# Patient Record
Sex: Male | Born: 1975 | Race: White | Hispanic: No | Marital: Single | Smoking: Current every day smoker
Health system: Southern US, Community
[De-identification: ages and names within clinical notes are randomized; demographics above are authoritative.]

## PROBLEM LIST (undated history)

## (undated) DIAGNOSIS — J189 Pneumonia, unspecified organism: Secondary | ICD-10-CM

---

## 2004-09-13 ENCOUNTER — Emergency Department: Payer: Self-pay | Admitting: Emergency Medicine

## 2004-09-13 ENCOUNTER — Other Ambulatory Visit: Payer: Self-pay

## 2007-06-10 ENCOUNTER — Emergency Department: Payer: Self-pay | Admitting: Emergency Medicine

## 2007-06-17 ENCOUNTER — Emergency Department: Payer: Self-pay | Admitting: Internal Medicine

## 2014-02-05 HISTORY — PX: NECK SURGERY: SHX720

## 2016-07-31 ENCOUNTER — Emergency Department
Admission: EM | Admit: 2016-07-31 | Discharge: 2016-07-31 | Disposition: A | Discharge status: Home / Self Care | Payer: Self-pay | Attending: Emergency Medicine | Admitting: Emergency Medicine

## 2016-07-31 ENCOUNTER — Encounter: Payer: Self-pay | Admitting: Emergency Medicine

## 2016-07-31 DIAGNOSIS — L237 Allergic contact dermatitis due to plants, except food: Secondary | ICD-10-CM | POA: Insufficient documentation

## 2016-07-31 DIAGNOSIS — F17200 Nicotine dependence, unspecified, uncomplicated: Secondary | ICD-10-CM | POA: Insufficient documentation

## 2016-07-31 MED ORDER — PREDNISONE 10 MG PO TABS: ORAL_TABLET | ORAL | 0 refills | Status: DC

## 2016-07-31 MED ORDER — DEXAMETHASONE SODIUM PHOSPHATE 10 MG/ML IJ SOLN
10.0000 mg | Freq: Once | INTRAMUSCULAR | Status: AC
  Administered 2016-07-31: 10 mg via INTRAMUSCULAR
  Filled 2016-07-31: qty 1

## 2016-07-31 NOTE — ED Triage Notes (Signed)
Pt with poison ivy all over for two days, been treating with calamine lotion and benedryl. No relief.

## 2016-07-31 NOTE — ED Notes (Signed)
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

## 2016-07-31 NOTE — Discharge Instructions (Signed)
Follow-up with [facility] if any continued problems. Begin taking prednisone as directed decreasing by one tablet every day. You may also take Benadryl at night if needed for itching. Cortizone cream is over-the-counter and can be applied directly to places that itch. Also avoid getting hot and sweaty as this will cause increased itching.

## 2016-07-31 NOTE — ED Provider Notes (Signed)
[facility] Emergency Department Provider Note ____________________________________________  Time seen: 9:14 AM  I have reviewed the triage vital signs and the nursing notes.  HISTORY  Chief Complaint  Poison Ivy   HPI Benjamin Perry is a 41 y.o. male is here with complaint of most likely poison oak or poison ivy. 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 Patient states that it is spreading and itching is uncontrolled with over-the-counter products. Initially it began on his legs and now has spread up around his neck and upper extremities.    History reviewed. No pertinent past medical history.  There are no active problems to display for this patient.   History reviewed. No pertinent surgical history.  Prior to Admission medications   Medication Sig Start Date End Date Taking? Authorizing Provider  predniSONE (DELTASONE) 10 MG tablet Take 6 tablets  today, on day 2 take 5 tablets, day 3 take 4 tablets, day 4 take 3 tablets, day 5 take  2 tablets and 1 tablet the last day 07/31/16   Tommi Rumps, PA-C    Allergies Penicillins  No family history on file.  Social History Social History  Substance Use Topics  . Smoking status: Current Every Day Smoker  . Smokeless tobacco: Never Used  . Alcohol use No    Review of Systems  Constitutional: Negative for fever. Cardiovascular: Negative for chest pain. Respiratory: Negative for shortness of breath. Skin:Positive for rash. Neurological: Negative for headaches, focal weakness or numbness. ____________________________________________  PHYSICAL EXAM:  VITAL SIGNS: ED Triage Vitals  Enc Vitals Group     BP 07/31/16 0907 124/71     Pulse Rate 07/31/16 0907 78     Resp 07/31/16 0907 18     Temp 07/31/16 0907 98 F (36.7 C)     Temp Source 07/31/16 0907 Oral     SpO2 07/31/16 0907 99 %     Weight 07/31/16 0902 160 lb (72.6 kg)     Height --    Head Circumference --      Peak Flow --      Pain Score --      Pain Loc --      Pain Edu? --      Excl. in GC? --     Constitutional: Alert and oriented. Well appearing and in no distress. Head: Normocephalic and atraumatic. Neck: Supple.  Cardiovascular: Normal rate, regular rhythm. Normal distal pulses. Respiratory: Normal respiratory effort.  Gastrointestinal: Soft and nontender. No distention. Musculoskeletal: Nontender with normal range of motion in all extremities.  Neurologic:  Normal gait. Normal speech and language. No gross focal neurologic deficits are appreciated. Skin:  Skin is warm, dry and intact. Multiple vesicles are noted around both ankles. There are a few lesions on the upper extremities and around the neck that is consistent with poison medical poison ivy. Psychiatric: Mood and affect are normal. Patient exhibits appropriate insight and judgment.  INITIAL IMPRESSION / ASSESSMENT AND PLAN / ED COURSE  Patient was given Decadron 10 mg IM and also a prescription for prednisone Dosepak. Patient is encouraged to use over-the-counter cortisone cream as needed for continued itching on specific areas. He is also told he could take Benadryl at night to prevent itching.    ____________________________________________  FINAL CLINICAL IMPRESSION(S) / ED DIAGNOSES  Final diagnoses:  Contact dermatitis due to poison ivy     Tommi Rumps, PA-C 07/31/16 1024    Daryel November  E, MD 07/31/16 [id number]

## 2018-02-05 ENCOUNTER — Emergency Department
Admission: EM | Admit: 2018-02-05 | Discharge: 2018-02-05 | Disposition: A | Discharge status: Home / Self Care | Payer: Self-pay | Attending: Student in an Organized Health Care Education/Training Program | Admitting: Student in an Organized Health Care Education/Training Program

## 2018-02-05 ENCOUNTER — Encounter: Payer: Self-pay | Admitting: Emergency Medicine

## 2018-02-05 DIAGNOSIS — Y9389 Activity, other specified: Secondary | ICD-10-CM | POA: Insufficient documentation

## 2018-02-05 DIAGNOSIS — W208XXA Other cause of strike by thrown, projected or falling object, initial encounter: Secondary | ICD-10-CM | POA: Insufficient documentation

## 2018-02-05 DIAGNOSIS — Y990 Civilian activity done for income or pay: Secondary | ICD-10-CM | POA: Insufficient documentation

## 2018-02-05 DIAGNOSIS — Y9269 Other specified industrial and construction area as the place of occurrence of the external cause: Secondary | ICD-10-CM | POA: Insufficient documentation

## 2018-02-05 DIAGNOSIS — F17210 Nicotine dependence, cigarettes, uncomplicated: Secondary | ICD-10-CM | POA: Insufficient documentation

## 2018-02-05 DIAGNOSIS — S0502XA Injury of conjunctiva and corneal abrasion without foreign body, left eye, initial encounter: Secondary | ICD-10-CM | POA: Insufficient documentation

## 2018-02-05 MED ORDER — EYE WASH OPHTH SOLN
1.0000 [drp] | OPHTHALMIC | Status: DC | PRN
  Administered 2018-02-05: 1 [drp] via OPHTHALMIC
  Filled 2018-02-05: qty 118

## 2018-02-05 MED ORDER — TETRACAINE HCL 0.5 % OP SOLN: 1.0000 [drp] | Freq: Once | OPHTHALMIC | Status: DC

## 2018-02-05 MED ORDER — FLUORESCEIN SODIUM 1 MG OP STRP
1.0000 | ORAL_STRIP | Freq: Once | OPHTHALMIC | Status: AC
  Administered 2018-02-05: 1 via OPHTHALMIC
  Filled 2018-02-05: qty 1

## 2018-02-05 MED ORDER — TETRACAINE HCL 0.5 % OP SOLN
2.0000 [drp] | Freq: Once | OPHTHALMIC | Status: AC
  Administered 2018-02-05: 2 [drp] via OPHTHALMIC
  Filled 2018-02-05: qty 4

## 2018-02-05 MED ORDER — GENTAMICIN SULFATE 0.3 % OP SOLN: 1.0000 [drp] | Freq: Four times a day (QID) | OPHTHALMIC | 0 refills | Status: DC

## 2018-02-05 NOTE — ED Provider Notes (Signed)
[facility] Emergency Department Provider Note   ____________________________________________   First MD Initiated Contact with Patient 02/05/18 [phone]     (approximate)  I have reviewed the triage vital signs and the nursing notes.   HISTORY  Chief Complaint Eye Problem    HPI Benjamin Perry is a 43 y.o. male patient complain of left eye pain secondary to foreign body which occurred at work yesterday.  Patient state he was moving some boards that were cut and believes sawdust fell into his left eye.  Patient state persistent pain and drainage since incident which occurred at 2 PM yesterday.  Patient does not wish to follow-up Worker's Compensation for complaint.  Patient denies loss of vision.  Patient rates the pain as 8/10.  Patient described the pain is "achy".  History reviewed. No pertinent past medical history.  There are no active problems to display for this patient.   History reviewed. No pertinent surgical history.  Prior to Admission medications   Medication Sig Start Date End Date Taking? Authorizing Provider  gentamicin (GARAMYCIN) 0.3 % ophthalmic solution Place 1 drop into the left eye 4 (four) times daily. 02/05/18   Joni ReiningSmith, Haskell Rihn K, PA-C  predniSONE (DELTASONE) 10 MG tablet Take 6 tablets  today, on day 2 take 5 tablets, day 3 take 4 tablets, day 4 take 3 tablets, day 5 take  2 tablets and 1 tablet the last day 07/31/16   Tommi RumpsSummers, Rhonda L, PA-C    Allergies Penicillins  No family history on file.  Social History Social History   Tobacco Use  . Smoking status: Current Every Day Smoker  . Smokeless tobacco: Never Used  Substance Use Topics  . Alcohol use: No  . Drug use: No    Review of Systems Constitutional: No fever/chills Eyes: No visual changes.  Foreign body sensation left eye. ENT: No sore throat. Cardiovascular: Denies chest pain. Respiratory: Denies shortness of breath. Gastrointestinal: No abdominal pain.  No  nausea, no vomiting.  No diarrhea.  No constipation. Genitourinary: Negative for dysuria. Musculoskeletal: Negative for back pain. Skin: Negative for rash. Neurological: Negative for headaches, focal weakness or numbness. Allergic/Immunilogical: Penicillin ____________________________________________   PHYSICAL EXAM:  VITAL SIGNS: ED Triage Vitals  Enc Vitals Group     BP 02/05/18 0653 132/81     Pulse Rate 02/05/18 0653 80     Resp 02/05/18 0653 18     Temp 02/05/18 0653 98.1 F (36.7 C)     Temp Source 02/05/18 0653 Oral     SpO2 02/05/18 0653 99 %     Weight 02/05/18 0643 140 lb (63.5 kg)     Height 02/05/18 0643 5\' 4"  (1.626 m)     Head Circumference --      Peak Flow --      Pain Score 02/05/18 0643 8     Pain Loc --      Pain Edu? --      Excl. in GC? --    Constitutional: Alert and oriented. Well appearing and in no acute distress. Eyes: Conjunctivae are normal. PERRL. EOMI. no visible foreign body.  Fluorescein stain reveal corneal abrasion lateral aspect left eye. Cardiovascular: Normal rate, regular rhythm. Grossly normal heart sounds.  Good peripheral circulation. Respiratory: Normal respiratory effort.  No retractions. Lungs CTAB. Skin:  Skin is warm, dry and intact. No rash noted. Psychiatric: Mood and affect are normal. Speech and behavior are normal.  ____________________________________________   LABS (all labs ordered are listed, but  only abnormal results are displayed)  Labs Reviewed - No data to display ____________________________________________  EKG   ____________________________________________  RADIOLOGY  ED MD interpretation:    Official radiology report(s): No results found.  ____________________________________________   PROCEDURES  Procedure(s) performed: None  Procedures  Critical Care performed: No  ____________________________________________   INITIAL IMPRESSION / ASSESSMENT AND PLAN / ED COURSE  As part of my  medical decision making, I reviewed the following data within the electronic MEDICAL RECORD NUMBER    Left eye pain secondary corneal abrasion.  Patient given discharge care instruction prescription for gentamicin.  Patient advised to follow-up with [facility] by calling for an appointment tomorrow for reevaluation.  Return to ED if condition worsens.      ____________________________________________   FINAL CLINICAL IMPRESSION(S) / ED DIAGNOSES  Final diagnoses:  Abrasion of left cornea, initial encounter     ED Discharge Orders         Ordered    gentamicin (GARAMYCIN) 0.3 % ophthalmic solution  4 times daily     02/05/18 0730           Note:  This document was prepared using Dragon voice recognition software and may include unintentional dictation errors.    Joni ReiningSmith, Nakya Weyand K, PA-C 02/05/18 Lamount [id number]    Willy Eddyobinson, Patrick, MD 02/06/18 [phone]

## 2018-02-05 NOTE — ED Notes (Signed)
See triage note  States he thinks he has gotten something in left eye yesterday afternoon  States he was able to wash his eye out  But feels like something is still there

## 2018-02-05 NOTE — Discharge Instructions (Addendum)
Use eyedrops as directed.  Call and schedule appointment tomorrow for follow-up.

## 2018-02-05 NOTE — ED Triage Notes (Signed)
Patient ambulatory to triage with steady gait, without difficulty or distress noted; pt reports at 2pm yesterday felt something go into left eye; has had persistent pain, drainage; denies desire to file workers comp

## 2019-10-01 ENCOUNTER — Emergency Department
Admission: EM | Admit: 2019-10-01 | Discharge: 2019-10-01 | Disposition: A | Discharge status: Home / Self Care | Payer: Self-pay | Attending: Emergency Medicine | Admitting: Emergency Medicine

## 2019-10-01 ENCOUNTER — Encounter: Payer: Self-pay | Admitting: Emergency Medicine

## 2019-10-01 ENCOUNTER — Emergency Department: Payer: Self-pay

## 2019-10-01 ENCOUNTER — Other Ambulatory Visit: Payer: Self-pay

## 2019-10-01 DIAGNOSIS — L03115 Cellulitis of right lower limb: Secondary | ICD-10-CM | POA: Insufficient documentation

## 2019-10-01 DIAGNOSIS — F17200 Nicotine dependence, unspecified, uncomplicated: Secondary | ICD-10-CM | POA: Insufficient documentation

## 2019-10-01 LAB — CBC WITH DIFFERENTIAL/PLATELET
Abs Immature Granulocytes: 0.02 10*3/uL (ref 0.00–0.07)
Basophils Absolute: 0.1 10*3/uL (ref 0.0–0.1)
Basophils Relative: 1 %
Eosinophils Absolute: 0.6 10*3/uL — ABNORMAL HIGH (ref 0.0–0.5)
Eosinophils Relative: 5 %
HCT: 41.7 % (ref 39.0–52.0)
Hemoglobin: 14 g/dL (ref 13.0–17.0)
Immature Granulocytes: 0 %
Lymphocytes Relative: 14 %
Lymphs Abs: 1.9 10*3/uL (ref 0.7–4.0)
MCH: 31.3 pg (ref 26.0–34.0)
MCHC: 33.6 g/dL (ref 30.0–36.0)
MCV: 93.3 fL (ref 80.0–100.0)
Monocytes Absolute: 1.1 10*3/uL — ABNORMAL HIGH (ref 0.1–1.0)
Monocytes Relative: 8 %
Neutro Abs: 9.4 10*3/uL — ABNORMAL HIGH (ref 1.7–7.7)
Neutrophils Relative %: 72 %
Platelets: 408 10*3/uL — ABNORMAL HIGH (ref 150–400)
RBC: 4.47 MIL/uL (ref 4.22–5.81)
RDW: 12.1 % (ref 11.5–15.5)
WBC: 13.1 10*3/uL — ABNORMAL HIGH (ref 4.0–10.5)
nRBC: 0 % (ref 0.0–0.2)

## 2019-10-01 LAB — BASIC METABOLIC PANEL
Anion gap: 11 (ref 5–15)
BUN: 11 mg/dL (ref 6–20)
CO2: 28 mmol/L (ref 22–32)
Calcium: 9.1 mg/dL (ref 8.9–10.3)
Chloride: 99 mmol/L (ref 98–111)
Creatinine, Ser: 1 mg/dL (ref 0.61–1.24)
GFR calc Af Amer: >60 mL/min (ref >60)
GFR calc non Af Amer: >60 mL/min (ref >60)
Glucose, Bld: 94 mg/dL (ref 70–99)
Potassium: 3.6 mmol/L (ref 3.5–5.1)
Sodium: 138 mmol/L (ref 135–145)

## 2019-10-01 MED ORDER — MORPHINE SULFATE (PF) 4 MG/ML IV SOLN
4.0000 mg | Freq: Once | INTRAVENOUS | Status: AC
  Administered 2019-10-01: 4 mg via INTRAVENOUS
  Filled 2019-10-01: qty 1

## 2019-10-01 MED ORDER — ONDANSETRON HCL 4 MG/2ML IJ SOLN
4.0000 mg | Freq: Once | INTRAMUSCULAR | Status: AC
  Administered 2019-10-01: 4 mg via INTRAVENOUS
  Filled 2019-10-01: qty 2

## 2019-10-01 MED ORDER — NAPROXEN 500 MG PO TABS: 500.0000 mg | ORAL_TABLET | Freq: Two times a day (BID) | ORAL | Status: DC

## 2019-10-01 MED ORDER — OXYCODONE-ACETAMINOPHEN 7.5-325 MG PO TABS: 1.0000 | ORAL_TABLET | Freq: Four times a day (QID) | ORAL | 0 refills | Status: DC | PRN

## 2019-10-01 MED ORDER — KETOROLAC TROMETHAMINE 30 MG/ML IJ SOLN
30.0000 mg | Freq: Once | INTRAMUSCULAR | Status: AC
  Administered 2019-10-01: 30 mg via INTRAVENOUS
  Filled 2019-10-01: qty 1

## 2019-10-01 MED ORDER — CLINDAMYCIN PHOSPHATE 600 MG/50ML IV SOLN
600.0000 mg | Freq: Once | INTRAVENOUS | Status: AC
  Administered 2019-10-01: 600 mg via INTRAVENOUS
  Filled 2019-10-01: qty 50

## 2019-10-01 MED ORDER — CLINDAMYCIN HCL 150 MG PO CAPS: 150.0000 mg | ORAL_CAPSULE | Freq: Four times a day (QID) | ORAL | 0 refills | Status: DC
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
## 2019-10-01 NOTE — Discharge Instructions (Addendum)
Follow discharge care instruction take medication as directed.  Return back in 4 days for wound check.

## 2019-10-03 LAB — AEROBIC CULTURE W GRAM STAIN (SUPERFICIAL SPECIMEN): Culture: NORMAL

## 2019-10-05 ENCOUNTER — Emergency Department: Payer: Self-pay

## 2019-10-05 ENCOUNTER — Emergency Department
Admission: EM | Admit: 2019-10-05 | Discharge: 2019-10-05 | Disposition: A | Discharge status: Home / Self Care | Payer: Self-pay | Attending: Emergency Medicine | Admitting: Emergency Medicine

## 2019-10-05 ENCOUNTER — Other Ambulatory Visit: Payer: Self-pay

## 2019-10-05 ENCOUNTER — Encounter: Payer: Self-pay | Admitting: Emergency Medicine

## 2019-10-05 DIAGNOSIS — F17200 Nicotine dependence, unspecified, uncomplicated: Secondary | ICD-10-CM | POA: Insufficient documentation

## 2019-10-05 DIAGNOSIS — Z79899 Other long term (current) drug therapy: Secondary | ICD-10-CM | POA: Insufficient documentation

## 2019-10-05 DIAGNOSIS — L03115 Cellulitis of right lower limb: Secondary | ICD-10-CM | POA: Insufficient documentation

## 2019-10-05 MED ORDER — OXYCODONE-ACETAMINOPHEN 10-325 MG PO TABS: 1.0000 | ORAL_TABLET | ORAL | 0 refills | Status: DC | PRN

## 2019-10-05 MED ORDER — METRONIDAZOLE 500 MG PO TABS: 500.0000 mg | ORAL_TABLET | Freq: Three times a day (TID) | ORAL | 0 refills | Status: AC

## 2019-10-05 NOTE — ED Notes (Signed)
See triage note  Presents for recheck  Right foot red and swollen

## 2019-10-05 NOTE — Discharge Instructions (Addendum)
Please call and schedule an appointment with the wound care center if not improving over the next 2 to 3 days.  Continue taking your clindamycin plus the new antibiotic prescribed today.  Soak your foot in Epson salt 3-4 times per day.
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
## 2019-10-05 NOTE — ED Triage Notes (Signed)
Pt reports was told to come back here for a wound recheck. Pt states swelling has went down and it feels better.

## 2019-10-06 LAB — CULTURE, BLOOD (ROUTINE X 2)
Culture: NO GROWTH
Culture: NO GROWTH
Special Requests: ADEQUATE

## 2020-05-22 ENCOUNTER — Emergency Department: Payer: Self-pay

## 2020-05-22 ENCOUNTER — Other Ambulatory Visit: Payer: Self-pay

## 2020-05-22 ENCOUNTER — Emergency Department
Admission: EM | Admit: 2020-05-22 | Discharge: 2020-05-22 | Disposition: A | Discharge status: Home / Self Care | Payer: Self-pay | Attending: Emergency Medicine | Admitting: Emergency Medicine

## 2020-05-22 ENCOUNTER — Encounter: Payer: Self-pay | Admitting: Intensive Care

## 2020-05-22 DIAGNOSIS — F17210 Nicotine dependence, cigarettes, uncomplicated: Secondary | ICD-10-CM | POA: Insufficient documentation

## 2020-05-22 DIAGNOSIS — S76012A Strain of muscle, fascia and tendon of left hip, initial encounter: Secondary | ICD-10-CM | POA: Insufficient documentation

## 2020-05-22 DIAGNOSIS — Y990 Civilian activity done for income or pay: Secondary | ICD-10-CM | POA: Insufficient documentation

## 2020-05-22 DIAGNOSIS — X500XXA Overexertion from strenuous movement or load, initial encounter: Secondary | ICD-10-CM | POA: Insufficient documentation

## 2020-05-22 MED ORDER — KETOROLAC TROMETHAMINE 10 MG PO TABS: 10.0000 mg | ORAL_TABLET | Freq: Three times a day (TID) | ORAL | 0 refills | Status: DC

## 2020-05-22 MED ORDER — KETOROLAC TROMETHAMINE 30 MG/ML IJ SOLN
30.0000 mg | Freq: Once | INTRAMUSCULAR | Status: AC
  Administered 2020-05-22: 30 mg via INTRAMUSCULAR
  Filled 2020-05-22: qty 1

## 2020-05-22 MED ORDER — CYCLOBENZAPRINE HCL 10 MG PO TABS
10.0000 mg | ORAL_TABLET | Freq: Once | ORAL | Status: AC
  Administered 2020-05-22: 10 mg via ORAL
  Filled 2020-05-22: qty 1

## 2020-05-22 MED ORDER — CYCLOBENZAPRINE HCL 5 MG PO TABS: 5.0000 mg | ORAL_TABLET | Freq: Three times a day (TID) | ORAL | 0 refills | Status: DC | PRN

## 2020-05-22 MED ORDER — HYDROMORPHONE HCL 1 MG/ML IJ SOLN: 1.0000 mg | INTRAMUSCULAR | Status: DC | PRN

## 2020-05-22 NOTE — Discharge Instructions (Addendum)
Your exam and x-ray are reassuring at this time.  No signs of any underlying arthritis or degenerative disc disease.  Symptoms likely are due to muscle strain of the gluteal muscle region.  The prescription muscle relaxant and anti-inflammatory as directed.  Hold any additional over-the-counter anti-inflammatories until this medicine course is completed.  Follow-up with Dr. Allena Katz for ongoing symptoms.

## 2020-05-22 NOTE — ED Triage Notes (Signed)
Patient c/o left hip pain that radiates down to the knee. Reports feelings of the leg giving out when putting pressure on it. Lifts heavy items at work.

## 2020-05-22 NOTE — ED Provider Notes (Incomplete)
[facility] Emergency Department Provider Note ____________________________________________  Time seen: ***  I have reviewed the triage vital signs and the nursing notes.  HISTORY  Chief Complaint  Sciatica   HPI Benjamin Perry is a 45 y.o. male ***   History reviewed. No pertinent past medical history.  There are no problems to display for this patient.   History reviewed. No pertinent surgical history.  Prior to Admission medications   Medication Sig Start Date End Date Taking? Authorizing Provider  clindamycin (CLEOCIN) 150 MG capsule Take 150 mg by mouth 3 (three) times daily.    [provider]  naproxen (NAPROSYN) 500 MG tablet Take 1 tablet (500 mg total) by mouth 2 (two) times daily with a meal. 10/01/19   Joni Reining, PA-C  oxyCODONE-acetaminophen (PERCOCET) 10-325 MG tablet Take 1 tablet by mouth every 4 (four) hours as needed for pain. 10/05/19   Chinita Pester, FNP    Allergies Penicillins  History reviewed. No pertinent family history.  Social History Social History   Tobacco Use  . Smoking status: Current Every Day Smoker    Types: Cigarettes  . Smokeless tobacco: Never Used  Substance Use Topics  . Alcohol use: No  . Drug use: No    Review of Systems  Constitutional: Negative for fever. Cardiovascular: Negative for chest pain. Respiratory: Negative for shortness of breath. Gastrointestinal: Negative for abdominal pain, vomiting and diarrhea. Genitourinary: Negative for dysuria. Musculoskeletal: Negative for back pain. Left leg/hip pain Skin: Negative for rash. Neurological: Negative for headaches, focal weakness or numbness. ____________________________________________  PHYSICAL EXAM:  VITAL SIGNS: ED Triage Vitals  Enc Vitals Group     BP 05/22/20 1456 126/81     Pulse Rate 05/22/20 1456 84     Resp 05/22/20 1456 16     Temp 05/22/20 1456 98.3 F (36.8 C)     Temp Source 05/22/20 1456 Oral      SpO2 05/22/20 1456 98 %     Weight 05/22/20 1457 160 lb (72.6 kg)     Height 05/22/20 1457 5\' 4"  (1.626 m)     Head Circumference --      Peak Flow --      Pain Score 05/22/20 1456 10     Pain Loc --      Pain Edu? --      Excl. in [place]? --     Constitutional: Alert and oriented. Well appearing and in no distress. Head: Normocephalic and atraumatic. Eyes: Conjunctivae are normal. Normal extraocular movements Neck: Supple. No thyromegaly. Cardiovascular: Normal rate, regular rhythm. Normal distal pulses. Respiratory: Normal respiratory effort. No wheezes/rales/rhonchi. Gastrointestinal: Soft and nontender. No distention. Musculoskeletal: Nontender with normal range of motion in all extremities.  Neurologic:  Normal gait without ataxia. Normal speech and language. No gross focal neurologic deficits are appreciated. Skin:  Skin is warm, dry and intact. No rash noted. Psychiatric: Mood and affect are normal. Patient exhibits appropriate insight and judgment. ____________________________________________   RADIOLOGY  DG Lumbar Spine   ____________________________________________  PROCEDURES  *** Procedures ____________________________________________  INITIAL IMPRESSION / ASSESSMENT AND PLAN / ED COURSE  {***summary/treatment plan - medications ?***}     Benjamin Perry was evaluated in Emergency Department on 05/22/2020 for the symptoms described in the history of present illness. He was evaluated in the context of the global COVID-19 pandemic, which necessitated consideration that the patient might be at risk for infection with the SARS-CoV-2 virus that causes COVID-19. Institutional protocols and algorithms that  pertain to the evaluation of patients at risk for COVID-19 are in a state of rapid change based on information released by regulatory bodies including the CDC and federal and state organizations. These policies and algorithms were followed during the patient's care in the  ED. ____________________________________________  FINAL CLINICAL IMPRESSION(S) / ED DIAGNOSES  Final diagnoses:  None

## 2020-05-22 NOTE — ED Provider Notes (Signed)
[facility] Emergency Department Provider Note ____________________________________________  Time seen: 1706  I have reviewed the triage vital signs and the nursing notes.  HISTORY  Chief Complaint  Sciatica  HPI Benjamin Perry is a 45 y.o. male Modena Jansky himself to the ED for evaluation of some left-sided hip and buttocks pain.  Patient describes onset occurred a day after he was lifting several large items out of the waist high band at work.  He denies any outright fall injury, but does note initial strain between the shoulder blades, before the delayed onset of pain to the right hip and buttocks.  He denies any bladder or bowel incontinence, foot drop, or saddle anesthesia.  He also denies any catching, clicking or locking of the left lower extremity.  He does report some leg weakness with weightbearing.  He denies a history of chronic ongoing hip or joint problems.  He has been taking over-the-counter Aleve and ibuprofen as well as Tylenol for symptom relief but has also been using topical anti-inflammatory creams for pain relief.  History reviewed. No pertinent past medical history.  There are no problems to display for this patient.   History reviewed. No pertinent surgical history.  Prior to Admission medications   Medication Sig Start Date End Date Taking? Authorizing Provider  cyclobenzaprine (FLEXERIL) 5 MG tablet Take 1 tablet (5 mg total) by mouth 3 (three) times daily as needed. 05/22/20  Yes Korie Streat, Charlesetta Ivory, PA-C  ketorolac (TORADOL) 10 MG tablet Take 1 tablet (10 mg total) by mouth every 8 (eight) hours. 05/22/20  Yes Genie Mirabal, Charlesetta Ivory, PA-C    Allergies Penicillins  History reviewed. No pertinent family history.  Social History Social History   Tobacco Use  . Smoking status: Current Every Day Smoker    Types: Cigarettes  . Smokeless tobacco: Never Used  Substance Use Topics  . Alcohol use: No  . Drug use: No    Review of  Systems  Constitutional: Negative for fever. Cardiovascular: Negative for chest pain. Respiratory: Negative for shortness of breath. Gastrointestinal: Negative for abdominal pain, vomiting and diarrhea. Genitourinary: Negative for dysuria. Musculoskeletal: Negative for back pain.  Left hip and buttocks pain as above. Skin: Negative for rash. Neurological: Negative for headaches, focal weakness or numbness. ____________________________________________  PHYSICAL EXAM:  VITAL SIGNS: ED Triage Vitals  Enc Vitals Group     BP 05/22/20 1456 126/81     Pulse Rate 05/22/20 1456 84     Resp 05/22/20 1456 16     Temp 05/22/20 1456 98.3 F (36.8 C)     Temp Source 05/22/20 1456 Oral     SpO2 05/22/20 1456 98 %     Weight 05/22/20 1457 160 lb (72.6 kg)     Height 05/22/20 1457 5\' 4"  (1.626 m)     Head Circumference --      Peak Flow --      Pain Score 05/22/20 1456 10     Pain Loc --      Pain Edu? --      Excl. in GC? --     Constitutional: Alert and oriented. Well appearing and in no distress. Head: Normocephalic and atraumatic. Eyes: Conjunctivae are normal. Normal extraocular movements Neck: Supple. normal range of motion. Cardiovascular: Normal rate, regular rhythm. Normal distal pulses. Respiratory: Normal respiratory effort. No wheezes/rales/rhonchi. Gastrointestinal: Soft and nontender. No distention. Musculoskeletal: Normal spinal alignment without midline tenderness, spasm, vomiting, or step-off.  Patient transitions from sit to stand without assistance.  He is able demonstrate normal lumbar flexion extension range.  Normal hip flexion on exam.  Normal internal and external rotation to the bilateral hips.  Nontender with normal range of motion in all extremities.  Neurologic: Cranial nerves II to XII grossly intact.  Normal LE DTRs bilaterally.  Negative supine straight leg raise bilaterally.  Normal gait without ataxia. Normal speech and language. No gross focal neurologic  deficits are appreciated. Skin:  Skin is warm, dry and intact. No rash noted. Psychiatric: Mood and affect are normal. Patient exhibits appropriate insight and judgment. ___________________________________________   RADIOLOGY  DG Lumbar Spine  IMPRESSION: No acute osseous abnormality in the lumbar spine.  I, Lissa Hoard, personally viewed and evaluated these images (plain radiographs) as part of my medical decision making, as well as reviewing the written report by the radiologist. ____________________________________________  PROCEDURES  Toradol 30 mg IM Flexeril 10 mg p.o.  Procedures ____________________________________________  INITIAL IMPRESSION / ASSESSMENT AND PLAN / ED COURSE  DDX: hip strain, LS strain, bursitis  Patient with ED evaluation of left hip and buttocks pain after mechanical injury.  Patient's exam is overall benign reassuring at this time.  No red flags on exam.  Patient with normal active range of motion to the left lower extremity.  No indication of any radicular symptoms or neuromuscular deficits.  Patient's x-ray does not reveal any acute fracture, dislocation, or DDD.  Patient's clinical exam is most consistent with a hip strain.  Was discharged with prescriptions for ketorolac and Flexeril take as directed.  He will follow-up with Ortho for ongoing symptoms.  Return precautions have been discussed.   Benjamin Perry was evaluated in Emergency Department on 05/22/2020 for the symptoms described in the history of present illness. He was evaluated in the context of the global COVID-19 pandemic, which necessitated consideration that the patient might be at risk for infection with the SARS-CoV-2 virus that causes COVID-19. Institutional protocols and algorithms that pertain to the evaluation of patients at risk for COVID-19 are in a state of rapid change based on information released by regulatory bodies including the CDC and federal and state  organizations. These policies and algorithms were followed during the patient's care in the ED. ____________________________________________  FINAL CLINICAL IMPRESSION(S) / ED DIAGNOSES  Final diagnoses:  Hip strain, left, initial encounter      Lissa Hoard, PA-C 05/22/20 1826    Phineas Semen, MD 05/22/20 [phone]

## 2021-02-05 DIAGNOSIS — R0789 Other chest pain: Secondary | ICD-10-CM

## 2021-02-05 HISTORY — DX: Other chest pain: R07.89
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
## 2021-11-03 ENCOUNTER — Other Ambulatory Visit: Payer: Self-pay

## 2021-11-03 ENCOUNTER — Emergency Department
Admission: EM | Admit: 2021-11-03 | Discharge: 2021-11-03 | Disposition: A | Discharge status: Home / Self Care | Payer: Self-pay | Attending: Student in an Organized Health Care Education/Training Program | Admitting: Student in an Organized Health Care Education/Training Program

## 2021-11-03 ENCOUNTER — Emergency Department: Payer: Self-pay

## 2021-11-03 DIAGNOSIS — R0789 Other chest pain: Secondary | ICD-10-CM | POA: Insufficient documentation

## 2021-11-03 DIAGNOSIS — M79602 Pain in left arm: Secondary | ICD-10-CM | POA: Insufficient documentation

## 2021-11-03 LAB — BASIC METABOLIC PANEL
Anion gap: 13 (ref 5–15)
BUN: 9 mg/dL (ref 6–20)
CO2: 25 mmol/L (ref 22–32)
Calcium: 9.5 mg/dL (ref 8.9–10.3)
Chloride: 102 mmol/L (ref 98–111)
Creatinine, Ser: 0.81 mg/dL (ref 0.61–1.24)
GFR, Estimated: >60 mL/min (ref >60)
Glucose, Bld: 101 mg/dL — ABNORMAL HIGH (ref 70–99)
Potassium: 4.4 mmol/L (ref 3.5–5.1)
Sodium: 140 mmol/L (ref 135–145)

## 2021-11-03 LAB — CBC
HCT: 48 % (ref 39.0–52.0)
Hemoglobin: 15.9 g/dL (ref 13.0–17.0)
MCH: 32.3 pg (ref 26.0–34.0)
MCHC: 33.1 g/dL (ref 30.0–36.0)
MCV: 97.4 fL (ref 80.0–100.0)
Platelets: 346 10*3/uL (ref 150–400)
RBC: 4.93 MIL/uL (ref 4.22–5.81)
RDW: 12.7 % (ref 11.5–15.5)
WBC: 9 10*3/uL (ref 4.0–10.5)
nRBC: 0 % (ref 0.0–0.2)

## 2021-11-03 LAB — TROPONIN I (HIGH SENSITIVITY): Troponin I (High Sensitivity): 3 ng/L (ref <18)

## 2021-11-03 MED ORDER — OXYCODONE-ACETAMINOPHEN 5-325 MG PO TABS: 1.0000 | ORAL_TABLET | ORAL | 0 refills | Status: AC | PRN

## 2021-11-03 MED ORDER — PREDNISONE 10 MG PO TABS: 10.0000 mg | ORAL_TABLET | Freq: Every day | ORAL | 0 refills | Status: DC

## 2021-11-03 MED ORDER — OXYCODONE-ACETAMINOPHEN 5-325 MG PO TABS
1.0000 | ORAL_TABLET | Freq: Once | ORAL | Status: AC
  Administered 2021-11-03: 1 via ORAL
  Filled 2021-11-03: qty 1

## 2021-11-03 NOTE — ED Triage Notes (Signed)
Pt here with cp since yesterday. Pt states pain is left sided and radiates to his arm. Pt states pain is constant is much worse today. Pt A&O x4 and stable in triage.

## 2021-11-03 NOTE — ED Provider Notes (Signed)
[facility] Provider Note    Event Date/Time   First MD Initiated Contact with Patient 11/03/21 1210     (approximate)   History   Chest Pain   HPI  Benjamin Perry is a 46 y.o. male with a history of cervical disc disease status post surgery who presents to the ER for evaluation of 24 hours of some chest discomfort but more concerning to him as he has been having pain in his left arm consistent with his previous neck pain.  The pain in his chest is worse with movement.  Denies any pleuritic pain.  No diaphoresis.  No pain ripping or tearing through to his back.  Denies any fevers or chills no cough or congestion.  No nausea or vomiting.  No numbness or tingling.  No weakness.  Eyes any trauma or injuries.     Physical Exam   Triage Vital Signs: ED Triage Vitals  Enc Vitals Group     BP 11/03/21 1132 129/81     Pulse Rate 11/03/21 1132 81     Resp 11/03/21 1132 18     Temp 11/03/21 1132 98.4 F (36.9 C)     Temp Source 11/03/21 1132 Oral     SpO2 11/03/21 1132 93 %     Weight 11/03/21 1130 160 lb 0.9 oz (72.6 kg)     Height 11/03/21 1130 5\' 4"  (1.626 m)     Head Circumference --      Peak Flow --      Pain Score 11/03/21 1130 5     Pain Loc --      Pain Edu? --      Excl. in [place]? --     Most recent vital signs: Vitals:   11/03/21 1132  BP: 129/81  Pulse: 81  Resp: 18  Temp: 98.4 F (36.9 C)  SpO2: 93%     Constitutional: Alert  Eyes: Conjunctivae are normal.  Head: Atraumatic. Nose: No congestion/rhinnorhea. Mouth/Throat: Mucous membranes are moist.   Neck: Painless ROM.  Cardiovascular:   Good peripheral circulation. Respiratory: Normal respiratory effort.  No retractions.  Gastrointestinal: Soft and nontender.  Musculoskeletal:  no deformity Neurologic:  MAE spontaneously. No gross focal neurologic deficits are appreciated.  Skin:  Skin is warm, dry and intact. No rash noted. Psychiatric: Mood and affect are normal. Speech  and behavior are normal.    ED Results / Procedures / Treatments   Labs (all labs ordered are listed, but only abnormal results are displayed) Labs Reviewed  BASIC METABOLIC PANEL - Abnormal; Notable for the following components:      Result Value   Glucose, Bld 101 (*)    All other components within normal limits  CBC  TROPONIN I (HIGH SENSITIVITY)     EKG  ED ECG REPORT I, Merlyn Lot, the attending physician, personally viewed and interpreted this ECG.   Date: 11/03/2021  EKG Time: 11:30  Rate: 60  Rhythm: sinus  Axis: normal  Intervals: normal  ST&T Change: no stemi, no depressions    RADIOLOGY Please see ED Course for my review and interpretation.  I personally reviewed all radiographic images ordered to evaluate for the above acute complaints and reviewed radiology reports and findings.  These findings were personally discussed with the patient.  Please see medical record for radiology report.    PROCEDURES:  Critical Care performed: No  Procedures   MEDICATIONS ORDERED IN ED: Medications  oxyCODONE-acetaminophen (PERCOCET/ROXICET) 5-325 MG per tablet 1 tablet (  has no administration in time range)     IMPRESSION / MDM / ASSESSMENT AND PLAN / ED COURSE  I reviewed the triage vital signs and the nursing notes.                              Differential diagnosis includes, but is not limited to, radiculopathy, musculoskeletal strain, pleurisy, pneumonia, pneumothorax, ACS, dissection, PE  Patient presenting to the ER for evaluation of symptoms as described above.  Base on symptoms, risk factors and considered above differential, this presenting complaint could reflect a potentially life-threatening illness therefore the patient will be placed on continuous pulse oximetry and telemetry for monitoring.  Laboratory evaluation will be sent to evaluate for the above complaints.  Patient currently is well-appearing in no acute distress.  His EKG is  nonischemic.  He had 24 hours of reproducible discomfort does not seem consistent with ACS given negative troponin.  Does not have any findings suggest bronchitis or COPD.  He is low risk by Wells criteria he is PERC negative.  Pain seems more consistent with referred pain probably from the neck.  Does not have any objective motor weakness or sensory deficits to suggest acute cord compression.  We discussed option for further observation here in the ER versus conservative management and referral.  Patient would like to proceed with outpatient follow-up and referral.      FINAL CLINICAL IMPRESSION(S) / ED DIAGNOSES   Final diagnoses:  Atypical chest pain  Left arm pain     Rx / DC Orders   ED Discharge Orders          Ordered    predniSONE (DELTASONE) 10 MG tablet  Daily        11/03/21 1257    oxyCODONE-acetaminophen (PERCOCET) 5-325 MG tablet  Every 4 hours PRN        11/03/21 1257             Note:  This document was prepared using Dragon voice recognition software and may include unintentional dictation errors.    Willy Eddy, MD 11/03/21 1300

## 2021-11-22 ENCOUNTER — Telehealth: Payer: Self-pay | Admitting: Neurosurgery

## 2021-11-22 NOTE — Telephone Encounter (Signed)
-----   Message from Peggyann Shoals sent at 11/22/2021  4:11 PM EDT ----- Regarding: [facility] EF FU Patient was seen in the ER on 11/03/2021 for neck and chest pain. The office was unsuccessful in contacting patient to schedule a hospital fu. If patient does call back, he can schedule an appointment to see Geronimo Boot PA for neck pain.

## 2022-02-27 IMAGING — CR DG LUMBAR SPINE 2-3V
3 series · 3 of 3 positions shown · non-contrast
Comparison: None.

CLINICAL DATA: Patient c/o left hip pain that radiates down to the
knee. Reports feelings of the leg giving out when putting pressure
on it. Lifts heavy items at work.

EXAM:
LUMBAR SPINE - 2-3 VIEW

[l-spine ap]
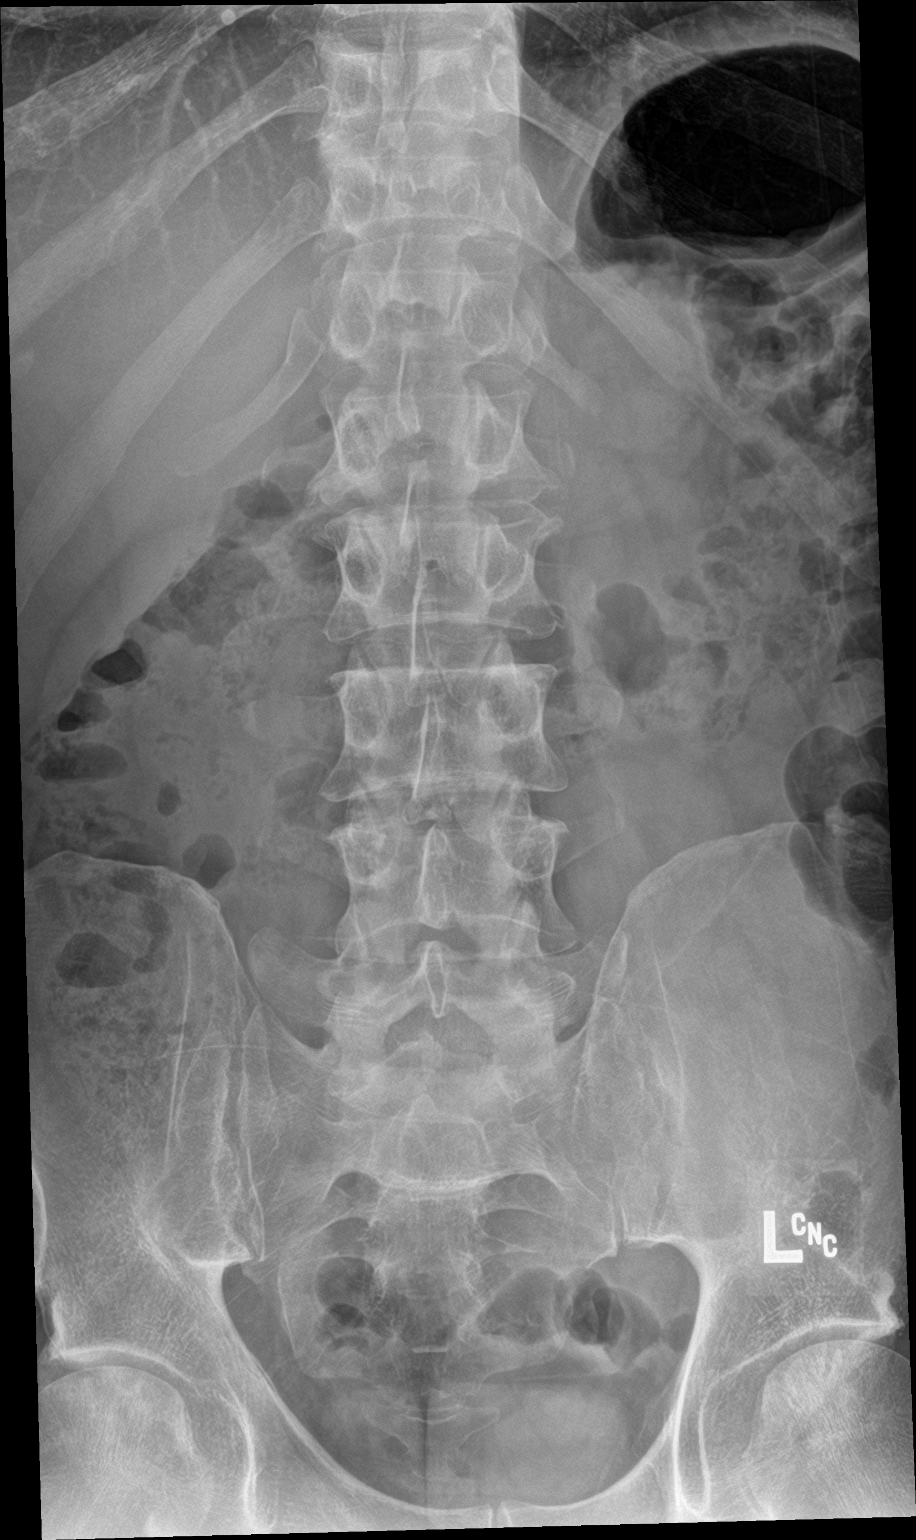

[l-spine spot]
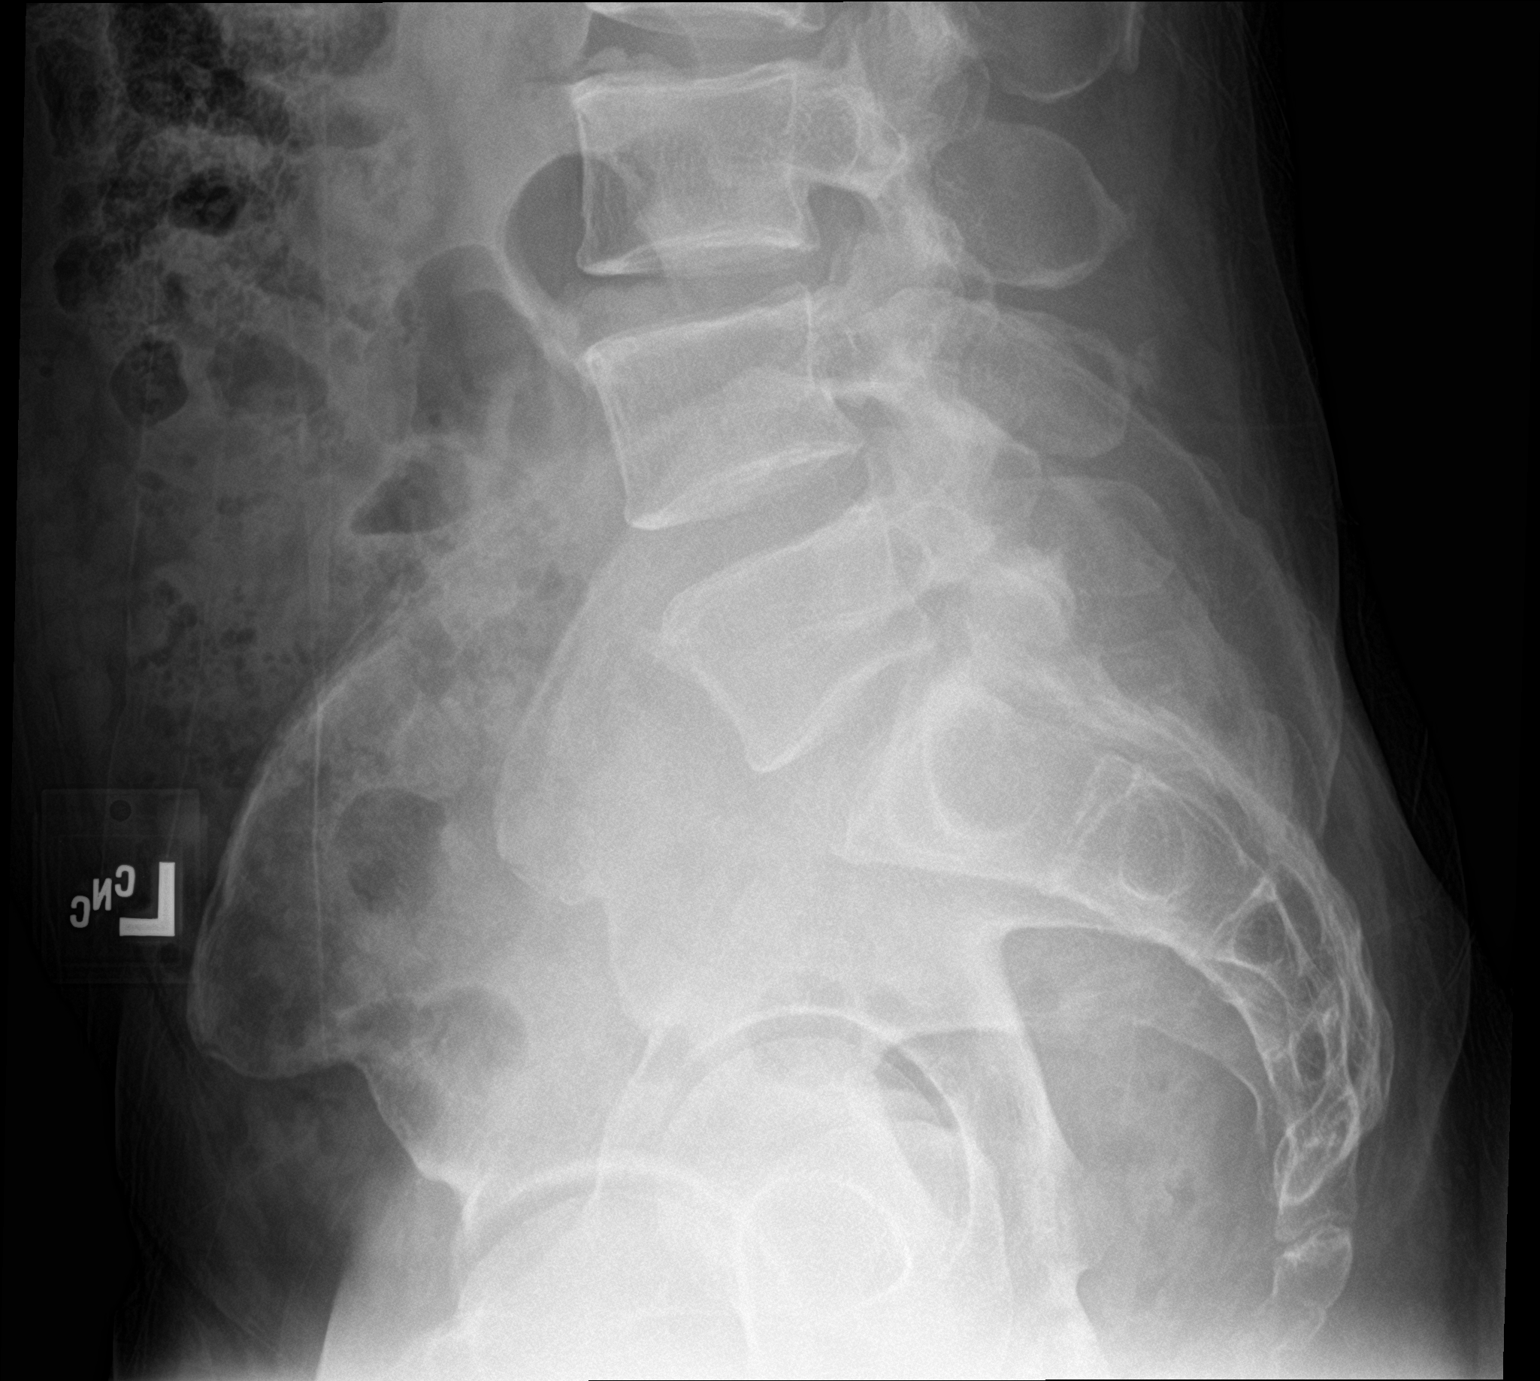

[l-spine lat]
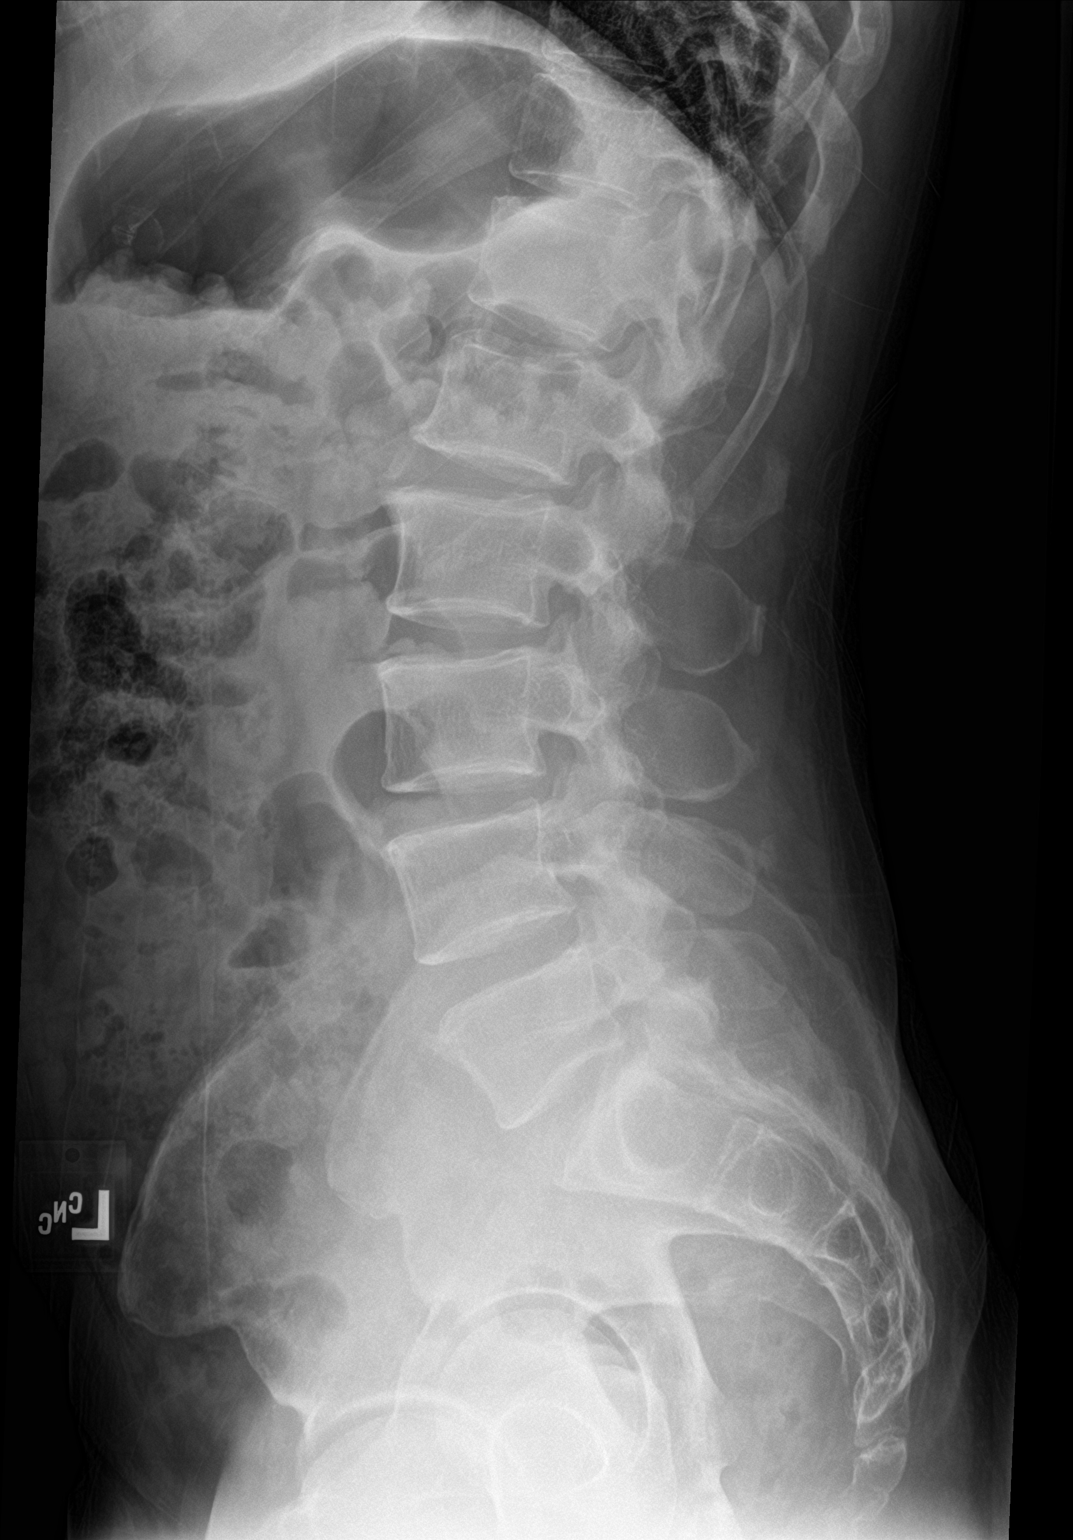

[3 of 3 positions shown; findings below may reference images not displayed]

FINDINGS: Normal alignment. Vertebral body heights and intervertebral disc
spaces are maintained. Mild multilevel spurring. No focal bone
lesion. SI joints are open and symmetric. Nonobstructive bowel gas
pattern.
IMPRESSION: No acute osseous abnormality in the lumbar spine.

## 2022-08-09 ENCOUNTER — Emergency Department
Admission: EM | Admit: 2022-08-09 | Discharge: 2022-08-09 | Discharge status: Against Medical Advice | Payer: Medicaid Other

## 2022-08-09 DIAGNOSIS — M542 Cervicalgia: Secondary | ICD-10-CM | POA: Diagnosis not present

## 2022-08-09 NOTE — ED Notes (Signed)
Pt walked out or ER, security notified.  Pt left before being triaged.
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
## 2022-11-20 DIAGNOSIS — M5416 Radiculopathy, lumbar region: Secondary | ICD-10-CM | POA: Diagnosis not present

## 2022-11-20 DIAGNOSIS — Z6828 Body mass index (BMI) 28.0-28.9, adult: Secondary | ICD-10-CM | POA: Diagnosis not present

## 2022-11-20 DIAGNOSIS — L237 Allergic contact dermatitis due to plants, except food: Secondary | ICD-10-CM | POA: Diagnosis not present

## 2023-02-06 DIAGNOSIS — M4802 Spinal stenosis, cervical region: Secondary | ICD-10-CM

## 2023-02-06 DIAGNOSIS — M5020 Other cervical disc displacement, unspecified cervical region: Secondary | ICD-10-CM

## 2023-02-06 HISTORY — DX: Other cervical disc displacement, unspecified cervical region: M50.20

## 2023-02-06 HISTORY — DX: Spinal stenosis, cervical region: M48.02

## 2023-04-03 NOTE — Progress Notes (Deleted)
   There were no vitals taken for this visit.   Subjective:    Patient ID: Benjamin Perry, male    DOB: 01-10-1976, 48 y.o.   MRN: [id number]  HPI: Benjamin Perry is a 48 y.o. male  No chief complaint on file.   Discussed the use of AI scribe software for clinical note transcription with the patient, who gave verbal consent to proceed.  History of Present Illness            No data to display          Relevant past medical, surgical, family and social history reviewed and updated as indicated. Interim medical history since our last visit reviewed. Allergies and medications reviewed and updated.  Review of Systems  Per HPI unless specifically indicated above     Objective:    There were no vitals taken for this visit.  {Vitals History (Optional):23777} Wt Readings from Last 3 Encounters:  11/03/21 160 lb 0.9 oz (72.6 kg)  05/22/20 160 lb (72.6 kg)  10/05/19 160 lb (72.6 kg)    Physical Exam  Results for orders placed or performed during the hospital encounter of 11/03/21  Basic metabolic panel   Collection Time: 11/03/21 11:32 AM  Result Value Ref Range   Sodium 140 135 - 145 mmol/L   Potassium 4.4 3.5 - 5.1 mmol/L   Chloride 102 98 - 111 mmol/L   CO2 25 22 - 32 mmol/L   Glucose, Bld 101 (H) 70 - 99 mg/dL   BUN 9 6 - 20 mg/dL   Creatinine, Ser 4.09 0.61 - 1.24 mg/dL   Calcium 9.5 8.9 - 81.1 mg/dL   GFR, Estimated >91 >47 mL/min   Anion gap 13 5 - 15  CBC   Collection Time: 11/03/21 11:32 AM  Result Value Ref Range   WBC 9.0 4.0 - 10.5 K/uL   RBC 4.93 4.22 - 5.81 MIL/uL   Hemoglobin 15.9 13.0 - 17.0 g/dL   HCT 82.9 56.2 - 13.0 %   MCV 97.4 80.0 - 100.0 fL   MCH 32.3 26.0 - 34.0 pg   MCHC 33.1 30.0 - 36.0 g/dL   RDW 86.5 78.4 - 69.6 %   Platelets 346 150 - 400 K/uL   nRBC 0.0 0.0 - 0.2 %  Troponin I (High Sensitivity)   Collection Time: 11/03/21 11:32 AM  Result Value Ref Range   Troponin I (High Sensitivity) 3 <18 ng/L   {Labs  (Optional):23779}    Assessment & Plan:   Problem List Items Addressed This Visit   None    Assessment and Plan             Follow up plan: No follow-ups on file.

## 2023-04-04 ENCOUNTER — Ambulatory Visit: Payer: Medicaid Other | Admitting: Nurse Practitioner

## 2023-04-21 ENCOUNTER — Emergency Department
Admission: EM | Admit: 2023-04-21 | Discharge: 2023-04-21 | Disposition: A | Discharge status: Home / Self Care | Attending: Emergency Medicine | Admitting: Emergency Medicine

## 2023-04-21 ENCOUNTER — Encounter: Payer: Self-pay | Admitting: Intensive Care

## 2023-04-21 ENCOUNTER — Other Ambulatory Visit: Payer: Self-pay

## 2023-04-21 DIAGNOSIS — M5432 Sciatica, left side: Secondary | ICD-10-CM | POA: Diagnosis not present

## 2023-04-21 DIAGNOSIS — M5442 Lumbago with sciatica, left side: Secondary | ICD-10-CM | POA: Diagnosis not present

## 2023-04-21 DIAGNOSIS — M79605 Pain in left leg: Secondary | ICD-10-CM | POA: Diagnosis present

## 2023-04-21 MED ORDER — METHOCARBAMOL 500 MG PO TABS: 1000.0000 mg | ORAL_TABLET | Freq: Three times a day (TID) | ORAL | 0 refills | Status: AC

## 2023-04-21 MED ORDER — PREDNISONE 20 MG PO TABS
60.0000 mg | ORAL_TABLET | Freq: Once | ORAL | Status: AC
  Administered 2023-04-21: 60 mg via ORAL
  Filled 2023-04-21: qty 3

## 2023-04-21 MED ORDER — PREDNISONE 50 MG PO TABS: ORAL_TABLET | ORAL | 0 refills | Status: DC

## 2023-04-21 MED ORDER — METHOCARBAMOL 500 MG PO TABS: 1000.0000 mg | ORAL_TABLET | Freq: Three times a day (TID) | ORAL | 0 refills | Status: DC

## 2023-04-21 MED ORDER — KETOROLAC TROMETHAMINE 15 MG/ML IJ SOLN
15.0000 mg | Freq: Once | INTRAMUSCULAR | Status: AC
  Administered 2023-04-21: 15 mg via INTRAMUSCULAR
  Filled 2023-04-21: qty 1

## 2023-04-21 NOTE — ED Notes (Signed)
 See triage notes. Patient c/o left leg pain for the past few years. Patient stated he had herniated disc repair and that his left side still goes numb. Patient stated his pain runs from his left buttock to the left foot. Patient c/o the left leg giving out if he tries to move anything heavy at work and walk.

## 2023-04-21 NOTE — ED Provider Notes (Signed)
 [facility] Provider Note    Event Date/Time   First MD Initiated Contact with Patient 04/21/23 [phone]     (approximate)   History   Leg Pain   HPI  Benjamin Perry is a 48 y.o. male with no documented PMH who presents for evaluation of left leg pain. Patient states this has been bothering him on and off for a few years.  He states that the pain starts in his left buttock and travels down the back of his left leg to the bottom of his foot.  He describes the pain as being sharp.  He states it feels like it is on the bone.  He has taken naproxen twice a day.  He says he has been to urgent care for this multiple times and has been given steroid shots and anti-inflammatories shots.  He recently got health insurance but has not been able to get in with a primary care provider yet.      Physical Exam   Triage Vital Signs: ED Triage Vitals  Encounter Vitals Group     BP 04/21/23 0745 (!) 130/90     Systolic BP Percentile --      Diastolic BP Percentile --      Pulse Rate 04/21/23 0745 81     Resp 04/21/23 0745 16     Temp 04/21/23 0745 97.7 F (36.5 C)     Temp Source 04/21/23 0745 Oral     SpO2 04/21/23 0745 100 %     Weight 04/21/23 0742 170 lb (77.1 kg)     Height 04/21/23 0742 5\' 4"  (1.626 m)     Head Circumference --      Peak Flow --      Pain Score 04/21/23 0742 8     Pain Loc --      Pain Education --      Exclude from Growth Chart --     Most recent vital signs: Vitals:   04/21/23 0745  BP: (!) 130/90  Pulse: 81  Resp: 16  Temp: 97.7 F (36.5 C)  SpO2: 100%   General: Awake, no distress.  CV:  Good peripheral perfusion.  Resp:  Normal effort.  Abd:  No distention.  Other:  Decreased sensation on the lateral left ankle, posterior tibialis pulses 2+ and regular bilaterally, 5/5 strength in lower extremities bilaterally.  Positive straight leg raise on the left side.   ED Results / Procedures / Treatments   Labs (all labs ordered  are listed, but only abnormal results are displayed) Labs Reviewed - No data to display   PROCEDURES:  Critical Care performed: No  Procedures   MEDICATIONS ORDERED IN ED: Medications  predniSONE (DELTASONE) tablet 60 mg (has no administration in time range)  ketorolac (TORADOL) 15 MG/ML injection 15 mg (has no administration in time range)     IMPRESSION / MDM / ASSESSMENT AND PLAN / ED COURSE  I reviewed the triage vital signs and the nursing notes.                             48 year old male presents for evaluation of left leg pain.  Patient's blood pressure is elevated otherwise vital signs are stable.  Patient NAD on exam.  Differential diagnosis includes, but is not limited to, muscle strain, sciatica, DVT, cauda equina.  Patient's presentation is most consistent with acute, uncomplicated illness.  Patient's description of his pain is consistent  with sciatica.  Denies red flag symptoms including saddle anesthesia and changes in bladder or bowel function.  Explained to the patient that I can offer some temporary relief with anti-inflammatories and steroids but that I really think he needs to try physical therapy.  I will also give him information for orthopedics.  He was encouraged to get an appointment with his primary care provider.  He voiced understanding, all questions were answered and he was stable at discharge.      FINAL CLINICAL IMPRESSION(S) / ED DIAGNOSES   Final diagnoses:  Sciatica of left side     Rx / DC Orders   ED Discharge Orders          Ordered    predniSONE (DELTASONE) 50 MG tablet        04/21/23 0909    methocarbamol (ROBAXIN) 500 MG tablet  3 times daily        04/21/23 0909             Note:  This document was prepared using Dragon voice recognition software and may include unintentional dictation errors.   Cameron Ali, PA-C 04/21/23 6045    Minna Antis, MD 04/21/23 1444

## 2023-04-21 NOTE — ED Triage Notes (Signed)
 Patient c/o left buttocks pain that radiates down leg. C/o pain

## 2023-04-21 NOTE — Discharge Instructions (Addendum)
 I believe your pain is coming from sciatica.  Please continue to take the Aleve twice a day.  Take the prednisone once a day for 4 days.  You can take the Robaxin 3 times daily as needed for muscle spasms.  This medication is sedating so do not drive or operate heavy machinery after taking it.  I recommend only taking it at night.  Please schedule a follow-up appointment with your primary care provider at [facility].  I have also attached information for orthopedics.

## 2023-04-26 ENCOUNTER — Telehealth: Payer: Self-pay | Admitting: Neurosurgery

## 2023-04-26 NOTE — Telephone Encounter (Signed)
 Unable to reach patient.

## 2023-04-26 NOTE — Telephone Encounter (Signed)
-----   Message from Lovenia Kim sent at 04/23/2023  8:20 AM EDT ----- Regarding: clinic f/u -sciatica, discharged from the ER.  Needs clinic follow-up and imaging Clinic: APP Timeline: next avail Tests to order: Nothing for this clinic visit, but the APP will probably order an MRI of the lumbar spine and flexion-extension x-rays of the lumbar spine.

## 2023-06-16 ENCOUNTER — Encounter: Payer: Self-pay | Admitting: Emergency Medicine

## 2023-06-16 ENCOUNTER — Emergency Department

## 2023-06-16 ENCOUNTER — Emergency Department
Admission: EM | Admit: 2023-06-16 | Discharge: 2023-06-16 | Disposition: A | Discharge status: Home / Self Care | Attending: Emergency Medicine | Admitting: Emergency Medicine

## 2023-06-16 ENCOUNTER — Other Ambulatory Visit: Payer: Self-pay

## 2023-06-16 DIAGNOSIS — R200 Anesthesia of skin: Secondary | ICD-10-CM

## 2023-06-16 DIAGNOSIS — M542 Cervicalgia: Secondary | ICD-10-CM

## 2023-06-16 DIAGNOSIS — G959 Disease of spinal cord, unspecified: Secondary | ICD-10-CM | POA: Diagnosis not present

## 2023-06-16 DIAGNOSIS — M4807 Spinal stenosis, lumbosacral region: Secondary | ICD-10-CM | POA: Diagnosis not present

## 2023-06-16 DIAGNOSIS — M50322 Other cervical disc degeneration at C5-C6 level: Secondary | ICD-10-CM | POA: Diagnosis not present

## 2023-06-16 DIAGNOSIS — M4852XA Collapsed vertebra, not elsewhere classified, cervical region, initial encounter for fracture: Secondary | ICD-10-CM | POA: Diagnosis not present

## 2023-06-16 DIAGNOSIS — R29818 Other symptoms and signs involving the nervous system: Secondary | ICD-10-CM | POA: Diagnosis not present

## 2023-06-16 DIAGNOSIS — M4802 Spinal stenosis, cervical region: Secondary | ICD-10-CM | POA: Diagnosis not present

## 2023-06-16 DIAGNOSIS — R202 Paresthesia of skin: Secondary | ICD-10-CM | POA: Diagnosis not present

## 2023-06-16 LAB — CBC WITH DIFFERENTIAL/PLATELET
Abs Immature Granulocytes: 0.02 10*3/uL (ref 0.00–0.07)
Basophils Absolute: 0.1 10*3/uL (ref 0.0–0.1)
Basophils Relative: 1 %
Eosinophils Absolute: 0.5 10*3/uL (ref 0.0–0.5)
Eosinophils Relative: 6 %
HCT: 43.6 % (ref 39.0–52.0)
Hemoglobin: 14.7 g/dL (ref 13.0–17.0)
Immature Granulocytes: 0 %
Lymphocytes Relative: 26 %
Lymphs Abs: 2.1 10*3/uL (ref 0.7–4.0)
MCH: 31.5 pg (ref 26.0–34.0)
MCHC: 33.7 g/dL (ref 30.0–36.0)
MCV: 93.6 fL (ref 80.0–100.0)
Monocytes Absolute: 0.6 10*3/uL (ref 0.1–1.0)
Monocytes Relative: 7 %
Neutro Abs: 5 10*3/uL (ref 1.7–7.7)
Neutrophils Relative %: 60 %
Platelets: 382 10*3/uL (ref 150–400)
RBC: 4.66 MIL/uL (ref 4.22–5.81)
RDW: 12.6 % (ref 11.5–15.5)
WBC: 8.2 10*3/uL (ref 4.0–10.5)
nRBC: 0 % (ref 0.0–0.2)

## 2023-06-16 LAB — BASIC METABOLIC PANEL WITH GFR
Anion gap: 8 (ref 5–15)
BUN: 13 mg/dL (ref 6–20)
CO2: 24 mmol/L (ref 22–32)
Calcium: 8.8 mg/dL — ABNORMAL LOW (ref 8.9–10.3)
Chloride: 105 mmol/L (ref 98–111)
Creatinine, Ser: 0.75 mg/dL (ref 0.61–1.24)
GFR, Estimated: >60 mL/min (ref >60)
Glucose, Bld: 96 mg/dL (ref 70–99)
Potassium: 4 mmol/L (ref 3.5–5.1)
Sodium: 137 mmol/L (ref 135–145)

## 2023-06-16 MED ORDER — KETOROLAC TROMETHAMINE 30 MG/ML IJ SOLN
15.0000 mg | Freq: Once | INTRAMUSCULAR | Status: AC
  Administered 2023-06-16: 15 mg via INTRAVENOUS
  Filled 2023-06-16: qty 1

## 2023-06-16 MED ORDER — OXYCODONE-ACETAMINOPHEN 5-325 MG PO TABS: 1.0000 | ORAL_TABLET | ORAL | 0 refills | Status: DC | PRN

## 2023-06-16 MED ORDER — LIDOCAINE 5 % EX PTCH: 1.0000 | MEDICATED_PATCH | Freq: Two times a day (BID) | CUTANEOUS | 0 refills | Status: DC

## 2023-06-16 NOTE — ED Notes (Signed)
 Not in room, pt in MRI

## 2023-06-16 NOTE — ED Notes (Signed)
 Not in room

## 2023-06-16 NOTE — ED Triage Notes (Signed)
 Pt in with bilateral leg numbness and tingling from sacrum down to feet. Pt states he has been having intermittent L arm numbness as well, has cervical herniated disc and had surgery for this in 2016 at [facility]. Denies any new injury, states he is having trouble working, carrying heavy loads and climbing ladders due to the numbness and tingling. Gait steady

## 2023-06-16 NOTE — ED Notes (Signed)
 Back from MRI.

## 2023-06-16 NOTE — Discharge Instructions (Addendum)
 Your MRI today showed you have a disc in your neck that is contacting your spinal cord.  This may be contributing to your symptoms and you will need to follow-up with a neurosurgeon.  They are available to see you in the clinic on Wednesday 5/14 at 10 AM, but you will need to call their office to confirm this appointment.  You may take pain medication as needed, but do not take it while driving or operating heavy machinery.  Please return to the emergency room for any new or worsening symptoms.

## 2023-06-16 NOTE — ED Provider Notes (Signed)
 [facility] Provider Note    Event Date/Time   First MD Initiated Contact with Patient 06/16/23 [phone]     (approximate)   History   Chief Complaint Extremity Numbness   HPI  Benjamin Perry is a 48 y.o. male with no significant past medical history who presents to the ED complaining of numbness.  Patient reports that he has been dealing with multiple months of numbness to his left arm and left leg with pain shooting down his left leg and occasional "locking up" of his left arm and hand.  Symptoms have progressed over the past week or two, with numbness and pain shooting down his right leg and his left leg occasionally "giving out."  He denies any numbness in his groin and has not had any bowel or bladder incontinence.  He denies any vision changes, speech changes, or facial droop.  He reports having surgery on his neck for a herniated disc in 2016, has not been following with a specialist since then.     Physical Exam   Triage Vital Signs: ED Triage Vitals  Encounter Vitals Group     BP 06/16/23 0636 (!) 163/97     Systolic BP Percentile --      Diastolic BP Percentile --      Pulse Rate 06/16/23 0636 68     Resp 06/16/23 0636 18     Temp 06/16/23 0636 97.7 F (36.5 C)     Temp Source 06/16/23 0636 Oral     SpO2 06/16/23 0636 100 %     Weight 06/16/23 0637 169 lb 15.6 oz (77.1 kg)     Height --      Head Circumference --      Peak Flow --      Pain Score 06/16/23 0636 7     Pain Loc --      Pain Education --      Exclude from Growth Chart --     Most recent vital signs: Vitals:   06/16/23 0715 06/16/23 0720  BP:    Pulse: 85 64  Resp:    Temp:    SpO2: 100% 100%    Constitutional: Alert and oriented. Eyes: Conjunctivae are normal. Head: Atraumatic. Nose: No congestion/rhinnorhea. Mouth/Throat: Mucous membranes are moist.  Neck: Midline cervical spine tenderness to palpation noted. Cardiovascular: Normal rate, regular rhythm. Grossly  normal heart sounds.  2+ radial and DP pulses bilaterally. Respiratory: Normal respiratory effort.  No retractions. Lungs CTAB. Gastrointestinal: Soft and nontender. No distention. Musculoskeletal: No lower extremity tenderness nor edema.  No midline thoracic or lumbar spinal tenderness. Neurologic:  Normal speech and language. No gross focal neurologic deficits are appreciated.    ED Results / Procedures / Treatments   Labs (all labs ordered are listed, but only abnormal results are displayed) Labs Reviewed  BASIC METABOLIC PANEL WITH GFR - Abnormal; Notable for the following components:      Result Value   Calcium 8.8 (*)    All other components within normal limits  CBC WITH DIFFERENTIAL/PLATELET     RADIOLOGY MRI brain reviewed and interpreted by me with no evidence of stroke.  PROCEDURES:  Critical Care performed: No  Procedures   MEDICATIONS ORDERED IN ED: Medications  ketorolac  (TORADOL ) 30 MG/ML injection 15 mg (15 mg Intravenous Given 06/16/23 0922)     IMPRESSION / MDM / ASSESSMENT AND PLAN / ED COURSE  I reviewed the triage vital signs and the nursing notes.  48 y.o. male with past medical history of cervical spine surgery who presents to the ED complaining of multiple months of left-sided numbness and pain, now with increased pain down his right leg and occasional weakness in his left leg.  Patient's presentation is most consistent with acute presentation with potential threat to life or bodily function.  Differential diagnosis includes, but is not limited to, stroke, cervical myelopathy, lumbar myelopathy, cauda equina, anemia, electrolyte abnormality, AKI, radiculopathy.  Patient nontoxic-appearing and in no acute distress, vital signs are unremarkable.  He is neuro vastly intact to his bilateral lower extremities and presentation does not seem consistent with cauda equina, however I am concerned for cervical myelopathy given  progressive symptoms affecting his left arm and legs.  Lower suspicion for stroke but will check MRI of his brain, cervical, thoracic, and lumbar spine.  Labs are pending at this time, will treat symptomatically with IV Toradol .  MRI brain, T-spine, and L-spine are negative for acute process.  MRI of patient cervical spine does show disc protrusion which is contacting the spinal cord just above his prior fusion at C5-C6.  Findings reviewed with Dr. Felipe Horton of neurosurgery, who recommends outpatient follow-up and will be available to see patient later this week.  We will prescribe short course of pain medication and patient counseled to return to the ED for new or worsening symptoms.  Patient agrees with plan.      FINAL CLINICAL IMPRESSION(S) / ED DIAGNOSES   Final diagnoses:  Neck pain  Numbness on left side  Cervical myelopathy (HCC)     Rx / DC Orders   ED Discharge Orders          Ordered    oxyCODONE -acetaminophen  (PERCOCET) 5-325 MG tablet  Every 4 hours PRN        06/16/23 0947    lidocaine (LIDODERM) 5 %  Every 12 hours        06/16/23 0947             Note:  This document was prepared using Dragon voice recognition software and may include unintentional dictation errors.   Twilla Galea, MD 06/16/23 [phone]

## 2023-06-18 ENCOUNTER — Encounter: Payer: Self-pay | Admitting: Neurosurgery

## 2023-06-18 NOTE — H&P (View-Only) (Signed)
 Referring Physician:  No referring provider defined for this encounter.  Primary Physician:  [facility], [place]  History of Present Illness: 06/18/2023 [name] is here today with a chief complaint of ***  Numbness down the left arm and left leg. He also complains of pain in the left arm and hand?  Any neck pain?  Dropping things?  Duration: *** Location: *** Quality: *** Severity: ***  Precipitating: aggravated by *** Modifying factors: made better by *** Weakness: none Timing: ***constant Bowel/Bladder Dysfunction: none  Conservative measures:  Physical therapy: *** has not participated in? Multimodal medical therapy including regular antiinflammatories: *** lidocaine patches, oxycodone , prednisone  Injections: *** no epidural steroid injections?  Past Surgery: *** Neck Surgery in 2016??  [name] has ***no symptoms of cervical myelopathy.  The symptoms are causing a significant impact on the patient's life.   I have utilized the care everywhere function in epic to review the outside records available from external health systems.  Review of Systems:  A 10 point review of systems is negative, except for the pertinent positives and negatives detailed in the HPI.  Past Medical History: No past medical history on file.  Past Surgical History: No past surgical history on file.  Allergies: Allergies as of 06/19/2023 - Review Complete 06/16/2023  Allergen Reaction Noted   Penicillins  07/31/2016    Medications:  Current Outpatient Medications:    lidocaine (LIDODERM) 5 %, Place 1 patch onto the skin every 12 (twelve) hours. Remove & Discard patch within 12 hours or as directed by MD, Disp: 10 patch, Rfl: 0   oxyCODONE -acetaminophen  (PERCOCET) 5-325 MG tablet, Take 1 tablet by mouth every 4 (four) hours as needed., Disp: 12 tablet, Rfl: 0   predniSONE  (DELTASONE ) 50 MG tablet, Take one tablet per day for 4 days., Disp: 4 tablet,  Rfl: 0  Social History: Social History   Tobacco Use   Smoking status: Every Day    Types: Cigarettes   Smokeless tobacco: Never  Vaping Use   Vaping status: Never Used  Substance Use Topics   Alcohol use: No   Drug use: No    Family Medical History: No family history on file.  Physical Examination: There were no vitals filed for this visit.  General: Patient is in no apparent distress. Attention to examination is appropriate.  Neck:   Supple.  Full range of motion.  Respiratory: Patient is breathing without any difficulty.   NEUROLOGICAL:     Awake, alert, oriented to person, place, and time.  Speech is clear and fluent.   Cranial Nerves: Pupils equal round and reactive to light.  Facial tone is symmetric.  Facial sensation is symmetric. Shoulder shrug is symmetric. Tongue protrusion is midline.    Strength: Side Biceps Triceps Deltoid Interossei Grip Wrist Ext. Wrist Flex.  R 5 5 5 5 5 5 5   L 5 5 5 5 5 5 5    Side Iliopsoas Quads Hamstring PF DF EHL  R 5 5 5 5 5 5   L 5 5 5 5 5 5    Reflexes are ***2+ and symmetric at the biceps, triceps, brachioradialis, patella and achilles.   Hoffman's is absent. Clonus is absent  Bilateral upper and lower extremity sensation is intact to light touch ***.     No evidence of dysmetria noted.  Gait is normal.    Imaging: *** I have personally reviewed the images and agree with the above interpretation.  Medical Decision Making/Assessment and Plan: [name] is a pleasant 48 y.o. male with ***  There are no diagnoses linked to this encounter.   Thank you for involving me in the care of this patient.    Carroll Clamp MD/MSCR Neurosurgery

## 2023-06-18 NOTE — Progress Notes (Unsigned)
 Referring Physician:  No referring provider defined for this encounter.  Primary Physician:  [facility], [place]  History of Present Illness: 06/18/2023 [name] is here today with a chief complaint of ***  Numbness down the left arm and left leg. He also complains of pain in the left arm and hand?  Any neck pain?  Dropping things?  Duration: *** Location: *** Quality: *** Severity: ***  Precipitating: aggravated by *** Modifying factors: made better by *** Weakness: none Timing: ***constant Bowel/Bladder Dysfunction: none  Conservative measures:  Physical therapy: *** has not participated in? Multimodal medical therapy including regular antiinflammatories: *** lidocaine patches, oxycodone , prednisone  Injections: *** no epidural steroid injections?  Past Surgery: *** Neck Surgery in 2016??  Benjamin Perry has ***no symptoms of cervical myelopathy.  The symptoms are causing a significant impact on the patient's life.   I have utilized the care everywhere function in epic to review the outside records available from external health systems.  Review of Systems:  A 10 point review of systems is negative, except for the pertinent positives and negatives detailed in the HPI.  Past Medical History: No past medical history on file.  Past Surgical History: No past surgical history on file.  Allergies: Allergies as of 06/19/2023 - Review Complete 06/16/2023  Allergen Reaction Noted   Penicillins  07/31/2016    Medications:  Current Outpatient Medications:    lidocaine (LIDODERM) 5 %, Place 1 patch onto the skin every 12 (twelve) hours. Remove & Discard patch within 12 hours or as directed by MD, Disp: 10 patch, Rfl: 0   oxyCODONE -acetaminophen  (PERCOCET) 5-325 MG tablet, Take 1 tablet by mouth every 4 (four) hours as needed., Disp: 12 tablet, Rfl: 0   predniSONE  (DELTASONE ) 50 MG tablet, Take one tablet per day for 4 days., Disp: 4 tablet,  Rfl: 0  Social History: Social History   Tobacco Use   Smoking status: Every Day    Types: Cigarettes   Smokeless tobacco: Never  Vaping Use   Vaping status: Never Used  Substance Use Topics   Alcohol use: No   Drug use: No    Family Medical History: No family history on file.  Physical Examination: There were no vitals filed for this visit.  General: Patient is in no apparent distress. Attention to examination is appropriate.  Neck:   Supple.  Full range of motion.  Respiratory: Patient is breathing without any difficulty.   NEUROLOGICAL:     Awake, alert, oriented to person, place, and time.  Speech is clear and fluent.   Cranial Nerves: Pupils equal round and reactive to light.  Facial tone is symmetric.  Facial sensation is symmetric. Shoulder shrug is symmetric. Tongue protrusion is midline.    Strength: Side Biceps Triceps Deltoid Interossei Grip Wrist Ext. Wrist Flex.  R 5 5 5 5 5 5 5   L 5 5 5 5 5 5 5    Side Iliopsoas Quads Hamstring PF DF EHL  R 5 5 5 5 5 5   L 5 5 5 5 5 5    Reflexes are ***2+ and symmetric at the biceps, triceps, brachioradialis, patella and achilles.   Hoffman's is absent. Clonus is absent  Bilateral upper and lower extremity sensation is intact to light touch ***.     No evidence of dysmetria noted.  Gait is normal.    Imaging: *** I have personally reviewed the images and agree with the above interpretation.  Medical Decision Making/Assessment and Plan: Mr.  Benjamin Perry is a pleasant 48 y.o. male with ***  There are no diagnoses linked to this encounter.   Thank you for involving me in the care of this patient.    Carroll Clamp MD/MSCR Neurosurgery

## 2023-06-19 ENCOUNTER — Ambulatory Visit
Admission: RE | Admit: 2023-06-19 | Discharge: 2023-06-19 | Disposition: A | Discharge status: Home / Self Care | Attending: Neurosurgery | Admitting: Neurosurgery

## 2023-06-19 ENCOUNTER — Encounter: Payer: Self-pay | Admitting: Neurosurgery

## 2023-06-19 ENCOUNTER — Ambulatory Visit
Admission: RE | Admit: 2023-06-19 | Discharge: 2023-06-19 | Disposition: A | Discharge status: Home / Self Care | Source: Ambulatory Visit | Attending: Neurosurgery | Admitting: Neurosurgery

## 2023-06-19 ENCOUNTER — Other Ambulatory Visit: Payer: Self-pay

## 2023-06-19 ENCOUNTER — Telehealth: Payer: Self-pay | Admitting: Neurosurgery

## 2023-06-19 ENCOUNTER — Ambulatory Visit (INDEPENDENT_AMBULATORY_CARE_PROVIDER_SITE_OTHER): Admitting: Neurosurgery

## 2023-06-19 VITALS — BP 142/82 | Ht 64.0 in | Wt 162.0 lb

## 2023-06-19 DIAGNOSIS — M50322 Other cervical disc degeneration at C5-C6 level: Secondary | ICD-10-CM | POA: Insufficient documentation

## 2023-06-19 DIAGNOSIS — M4712 Other spondylosis with myelopathy, cervical region: Secondary | ICD-10-CM | POA: Diagnosis not present

## 2023-06-19 DIAGNOSIS — Z01818 Encounter for other preprocedural examination: Secondary | ICD-10-CM

## 2023-06-19 DIAGNOSIS — Z981 Arthrodesis status: Secondary | ICD-10-CM | POA: Insufficient documentation

## 2023-06-19 DIAGNOSIS — M48061 Spinal stenosis, lumbar region without neurogenic claudication: Secondary | ICD-10-CM | POA: Diagnosis not present

## 2023-06-19 DIAGNOSIS — M47812 Spondylosis without myelopathy or radiculopathy, cervical region: Secondary | ICD-10-CM | POA: Diagnosis not present

## 2023-06-19 DIAGNOSIS — Z4789 Encounter for other orthopedic aftercare: Secondary | ICD-10-CM | POA: Diagnosis not present

## 2023-06-19 MED ORDER — METHYLPREDNISOLONE 4 MG PO TBPK: ORAL_TABLET | ORAL | 0 refills | Status: DC

## 2023-06-19 MED ORDER — OXYCODONE-ACETAMINOPHEN 5-325 MG PO TABS: 1.0000 | ORAL_TABLET | ORAL | 0 refills | Status: DC | PRN

## 2023-06-19 NOTE — Progress Notes (Deleted)
 Herniated disk, 2016,   Left sided pain  and loss of function of his left hand.   Dx w/ sciatica, steroids, help some but goes away quickly.   Lerhemitetes when sneezes, coughts or passes gas,   Legs aren't listenign as much.    Needs to use his hands to straiblize him now.   Last 5-6 months, getting much worse recently.    Very weak, lfet bicep and wrist exteions. Deltoid ist rong.      Left foot drop, getting worse over past 2 mothsn, now right.    Cord eval, xrays, dosepack.

## 2023-06-19 NOTE — Telephone Encounter (Signed)
 Patient called stating that Medicaid will not cover his pain medicine, please advise

## 2023-06-19 NOTE — Patient Instructions (Signed)
 Please see below for information in regards to your upcoming surgery:   Please get xrays today   [facility] will call you for an appointment. Please let us  know if you do not hear from them in the next couple of days.   Planned surgery: C5-6 anterior cervical discectomy and fusion   Surgery date: 07/09/23 at [facility]: [address], [place], [place] [address]) - you will find out your arrival time the business day before your surgery.   Pre-op appointment at [facility] Pre-admit Testing: you will receive a call with a date/time for this appointment. If you are scheduled for an in person appointment, Pre-admit Testing is located on the first floor of the [facility], [address], [address]. During this appointment, they will advise you which medications you can take the morning of surgery, and which medications you will need to hold for surgery. Labs (such as blood work, EKG) may be done at your pre-op appointment. You are not required to fast for these labs. Should you need to change your pre-op appointment, please call Pre-admit testing at [phone].      NSAIDS (Non-steroidal anti-inflammatory drugs): because you are having a fusion, please avoid taking any NSAIDS (examples: ibuprofen, motrin, aleve , naproxen , meloxicam, diclofenac) for 3 months after surgery. Celebrex is an exception and is OK to take, if prescribed. Tylenol  is not an NSAID.    Common restrictions after surgery: No bending, lifting, or twisting ("BLT"). Avoid lifting objects heavier than 10 pounds for the first 6 weeks after surgery. Where possible, avoid household activities that involve lifting, bending, reaching, pushing, or pulling such as laundry, vacuuming, grocery shopping, and childcare. Try to arrange for help from friends and family for these activities while you heal. Do not drive while taking prescription pain medication. Weeks 6 through  12 after surgery: avoid lifting more than 25 pounds.    X-rays after surgery: Because you are having a fusion: for appointments after your 2 week follow-up: please arrive at the [facility] outpatient imaging center ([address], [place]) or [facility] one hour prior to your appointment for x-rays. This applies to every appointment after your 2 week follow-up. Failure to do so may result in your appointment being rescheduled.   How to contact us :  If you have any questions/concerns before or after surgery, you can reach us  at [phone], or you can send a mychart message. We can be reached by phone or mychart 8am-4pm, Monday-Friday.  *Please note: Calls after 4pm are forwarded to a third party answering service. Mychart messages are not routinely monitored during evenings, weekends, and holidays. Please call our office to contact the answering service for urgent concerns during non-business hours.   If you have FMLA/disability paperwork, please drop it off or fax it to [phone], attention Patty.   Appointments/FMLA & disability paperwork: Gerlean Kocher, & Maryann Smalls Registered Nurses/Surgery schedulers: Kwamane Whack & Lauren Medical Assistants: Donnajean Fuse Physician Assistants: Ludwig Safer, PA-C, Anastacio Karvonen, PA-C & Lucetta Russel, PA-C Surgeons: Jodeen Munch, MD & Henderson Lock, MD   [facility] PREADMIT TESTING VISIT and SURGERY INFORMATION SHEET   Now that surgery has been scheduled you can anticipate several phone calls from [facility] services. A pharmacy technician will call you to verify your current list of medications taken at home.               The Pre-Service Center will  call to verify your insurance information and to give you billing estimates and information.             The Preadmit Testing Office will be calling to schedule a visit to obtain information for the anesthesia team and provide instructions on  preparation for surgery.  What can you expect for the Preadmit Testing Visit: Appointments may be scheduled in-person or by telephone.  If a telephone visit is scheduled, you may be asked to come into the office to have lab tests or other studies performed.   This visit will not be completed any greater than 14 days prior to your surgery.  If your surgery has been scheduled for a future date, please do not be alarmed if we have not contacted you to schedule an appointment more than a month prior to the surgery date.    Please be prepared to provide the following information during this appointment:            -Personal medical history                                               -Medication and allergy list            -Any history of problems with anesthesia              -Recent lab work or diagnostic studies            -Please notify us  of any needs we should be aware of to provide the best care possible           -You will be provided with instructions on how to prepare for your surgery.    On The Day of Surgery:  You must have a driver to take you home after surgery, you will be asked not to drive for 24 hours following surgery.  Taxi, [employer] and non-medical transport will not be acceptable means of transportation unless you have a responsible individual who will be traveling with you.  Visitors in the surgical area:   2 people will be able to visit you in your room once your preparation for surgery has been completed. During surgery, your visitors will be asked to wait in the Surgery Waiting Area.  It is not a requirement for them to stay, if they prefer to leave and come back.  Your visitor(s) will be given an update once the surgery has been completed.  No visitors are allowed in the initial recovery room to respect patient privacy and safety.  Once you are more awake and transfer to the secondary recovery area, or are transferred to an inpatient room, visitors will again be able to see  you.  To respect and protect your privacy: We will ask on the day of surgery who your driver will be and what the contact number for that individual will be. We will ask if it is okay to share information with this individual, or if there is an alternative individual that we, or the surgeon, should contact to provide updates and information. If family or friends come to the surgical information desk requesting information about you, who you have not listed with us , no information will be given.   It may be helpful to designate someone as the main contact who will be responsible for updating your other friends and family.  PREADMIT TESTING OFFICE: [phone] SAME DAY SURGERY: [phone] We look forward to caring for you before and throughout the process of your surgery.

## 2023-06-19 NOTE — Telephone Encounter (Signed)
 I called the pharmacy and they said it was not going through due to the # of tablets but the patient paid out of pocket with a discount card so we do not need to do anything on our end.  I called the patient to discuss the above and he states he has not picked it up yet because they didn't have it ready but he is on the way now. I offered to call his insurance but he declined and plans to use the discount card.  I also informed him that I attempted to submit his surgery authorization and [employer] says his policy is terminating 07/06/23. He states his insurance renews every 3 months and it is up for renewal, but it should stay the same. 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

## 2023-06-25 DIAGNOSIS — R490 Dysphonia: Secondary | ICD-10-CM | POA: Diagnosis not present

## 2023-06-26 NOTE — Telephone Encounter (Signed)
 I spoke with [employer] and confirmed his policy is renewed/effective through 06/2024.

## 2023-06-27 ENCOUNTER — Inpatient Hospital Stay
Admission: RE | Admit: 2023-06-27 | Discharge: 2023-06-27 | Disposition: A | Discharge status: Home / Self Care | Source: Ambulatory Visit

## 2023-06-27 ENCOUNTER — Encounter
Admission: RE | Admit: 2023-06-27 | Discharge: 2023-06-27 | Disposition: A | Discharge status: Home / Self Care | Source: Ambulatory Visit | Attending: Neurosurgery | Admitting: Neurosurgery

## 2023-06-27 ENCOUNTER — Telehealth: Payer: Self-pay

## 2023-06-27 ENCOUNTER — Other Ambulatory Visit: Payer: Self-pay

## 2023-06-27 DIAGNOSIS — Z01812 Encounter for preprocedural laboratory examination: Secondary | ICD-10-CM

## 2023-06-27 DIAGNOSIS — R030 Elevated blood-pressure reading, without diagnosis of hypertension: Secondary | ICD-10-CM

## 2023-06-27 DIAGNOSIS — R079 Chest pain, unspecified: Secondary | ICD-10-CM

## 2023-06-27 HISTORY — DX: Pneumonia, unspecified organism: J18.9

## 2023-06-27 NOTE — Telephone Encounter (Signed)
 I received a message from the pre-admit testing department that Benjamin Perry missed his appointment with them today. I spoke with him last week on [phone]. I just tried calling him, but he did not answer. I left a message telling him he missed an appointment and to call their department to reschedule ASAP or to call me w/ any questions.

## 2023-06-27 NOTE — Patient Instructions (Addendum)
 Your procedure is scheduled on:07/09/23 - Tuesday Report to the Registration Desk on the 1st floor of the [facility]. To find out your arrival time, please call [phone] between 1PM - 3PM on: 07/08/23 - Monday If your arrival time is 6:00 am, do not arrive before that time as the [facility] entrance doors do not open until 6:00 am.  REMEMBER: Instructions that are not followed completely may result in serious medical risk, up to and including death; or upon the discretion of your surgeon and anesthesiologist your surgery may need to be rescheduled.  Do not eat food after midnight the night before surgery.  No gum chewing or hard candies.  You may however, drink CLEAR liquids up to 2 hours before you are scheduled to arrive for your surgery. Do not drink anything within 2 hours of your scheduled arrival time.  Clear liquids include: - water  - apple juice without pulp - gatorade (not RED colors) - black coffee or tea (Do NOT add milk or creamers to the coffee or tea) Do NOT drink anything that is not on this list.   You may take if needed up until 07/08/23, Anti-inflammatories (NSAIDS) such as Advil, Aleve , Ibuprofen, Motrin, Naproxen , Naprosyn  and Aspirin based products such as Excedrin, Goody's Powder, BC Powder. You may take Tylenol  if needed for pain up until the day of surgery.  Stop ANY OVER THE COUNTER supplements until after surgery.  Continue taking all of your other prescription medications up until the day of surgery.  ON THE DAY OF SURGERY ONLY TAKE THESE MEDICATIONS WITH SIPS OF WATER:  oxyCODONE -acetaminophen  if needed   No Alcohol for 24 hours before or after surgery.  No Smoking including e-cigarettes for 24 hours before surgery.  No chewable tobacco products for at least 6 hours before surgery.  No nicotine patches on the day of surgery.  Do not use any "recreational" drugs for at least a week (preferably 2 weeks) before your surgery.  Please be  advised that the combination of cocaine and anesthesia may have negative outcomes, up to and including death. If you test positive for cocaine, your surgery will be cancelled.  On the morning of surgery brush your teeth with toothpaste and water, you may rinse your mouth with mouthwash if you wish. Do not swallow any toothpaste or mouthwash.   Do not wear jewelry, make-up, hairpins, clips or nail polish.  For welded (permanent) jewelry: bracelets, anklets, waist bands, etc.  Please have this removed prior to surgery.  If it is not removed, there is a chance that hospital personnel will need to cut it off on the day of surgery.  Do not wear lotions, powders, or perfumes.   Do not shave body hair from the neck down 48 hours before surgery.  Contact lenses, hearing aids and dentures may not be worn into surgery.  Do not bring valuables to the hospital. [facility] is not responsible for any missing/lost belongings or valuables.   Notify your doctor if there is any change in your medical condition (cold, fever, infection).  Wear comfortable clothing (specific to your surgery type) to the hospital.  After surgery, you can help prevent lung complications by doing breathing exercises.  Take deep breaths and cough every 1-2 hours. Your doctor may order a device called an Incentive Spirometer to help you take deep breaths.  When coughing or sneezing, hold a pillow firmly against your incision with both hands. This is called "splinting." Doing this helps protect  your incision. It also decreases belly discomfort.  If you are being admitted to the hospital overnight, leave your suitcase in the car. After surgery it may be brought to your room.  In case of increased patient census, it may be necessary for you, the patient, to continue your postoperative care in the Same Day Surgery department.  If you are being discharged the day of surgery, you will not be allowed to drive home. You will need a  responsible individual to drive you home and stay with you for 24 hours after surgery.   If you are taking public transportation, you will need to have a responsible individual with you.  Please call the Pre-admissions Testing Dept. at [phone] if you have any questions about these instructions.  Surgery Visitation Policy:  Patients having surgery or a procedure may have two visitors.  Children under the age of 86 must have an adult with them who is not the patient.  Inpatient Visitation:    Visiting hours are 7 a.m. to 8 p.m. Up to four visitors are allowed at one time in a patient room. The visitors may rotate out with other people during the day.  One visitor age 80 or older may stay with the patient overnight and must be in the room by 8 p.m.    Pre-operative 5 CHG Bath Instructions   You can play a key role in reducing the risk of infection after surgery. Your skin needs to be as free of germs as possible. You can reduce the number of germs on your skin by washing with CHG (chlorhexidine gluconate) soap before surgery. CHG is an antiseptic soap that kills germs and continues to kill germs even after washing.   DO NOT use if you have an allergy to chlorhexidine/CHG or antibacterial soaps. If your skin becomes reddened or irritated, stop using the CHG and notify one of our RNs at [phone].   Please shower with the CHG soap starting 4 days before surgery using the following schedule: 05/30 - 06/03.   Please keep in mind the following:  DO NOT shave, including legs and underarms, starting the day of your first shower.   You may shave your face at any point before/day of surgery.  Place clean sheets on your bed the day you start using CHG soap. Use a clean washcloth (not used since being washed) for each shower. DO NOT sleep with pets once you start using the CHG.   CHG Shower Instructions:  If you choose to wash your hair and private area, wash first with your normal  shampoo/soap.  After you use shampoo/soap, rinse your hair and body thoroughly to remove shampoo/soap residue.  Turn the water OFF and apply about 3 tablespoons (45 ml) of CHG soap to a CLEAN washcloth.  Apply CHG soap ONLY FROM YOUR NECK DOWN TO YOUR TOES (washing for 3-5 minutes)  DO NOT use CHG soap on face, private areas, open wounds, or sores.  Pay special attention to the area where your surgery is being performed.  If you are having back surgery, having someone wash your back for you may be helpful. Wait 2 minutes after CHG soap is applied, then you may rinse off the CHG soap.  Pat dry with a clean towel  Put on clean clothes/pajamas   If you choose to wear lotion, please use ONLY the CHG-compatible lotions on the back of this paper.     Additional instructions for the day of surgery: DO NOT  APPLY any lotions, deodorants, cologne, or perfumes.   Put on clean/comfortable clothes.  Brush your teeth.  Ask your nurse before applying any prescription medications to the skin.      CHG Compatible Lotions   Aveeno Moisturizing lotion  Cetaphil Moisturizing Cream  Cetaphil Moisturizing Lotion  Clairol Herbal Essence Moisturizing Lotion, Dry Skin  Clairol Herbal Essence Moisturizing Lotion, Extra Dry Skin  Clairol Herbal Essence Moisturizing Lotion, Normal Skin  Curel Age Defying Therapeutic Moisturizing Lotion with Alpha Hydroxy  Curel Extreme Care Body Lotion  Curel Soothing Hands Moisturizing Hand Lotion  Curel Therapeutic Moisturizing Cream, Fragrance-Free  Curel Therapeutic Moisturizing Lotion, Fragrance-Free  Curel Therapeutic Moisturizing Lotion, Original Formula  Eucerin Daily Replenishing Lotion  Eucerin Dry Skin Therapy Plus Alpha Hydroxy Crme  Eucerin Dry Skin Therapy Plus Alpha Hydroxy Lotion  Eucerin Original Crme  Eucerin Original Lotion  Eucerin Plus Crme Eucerin Plus Lotion  Eucerin TriLipid Replenishing Lotion  Keri Anti-Bacterial Hand Lotion  Keri Deep  Conditioning Original Lotion Dry Skin Formula Softly Scented  Keri Deep Conditioning Original Lotion, Fragrance Free Sensitive Skin Formula  Keri Lotion Fast Absorbing Fragrance Free Sensitive Skin Formula  Keri Lotion Fast Absorbing Softly Scented Dry Skin Formula  Keri Original Lotion  Keri Skin Renewal Lotion Keri Silky Smooth Lotion  Keri Silky Smooth Sensitive Skin Lotion  Nivea Body Creamy Conditioning Oil  Nivea Body Extra Enriched Teacher, adult education Moisturizing Lotion Nivea Crme  Nivea Skin Firming Lotion  NutraDerm 30 Skin Lotion  NutraDerm Skin Lotion  NutraDerm Therapeutic Skin Cream  NutraDerm Therapeutic Skin Lotion  ProShield Protective Hand Cream  Provon moisturizing lotion

## 2023-06-27 NOTE — Patient Instructions (Addendum)
 Your procedure is scheduled on: 07/09/23 - Tuesday Report to the Registration Desk on the 1st floor of the [facility]. To find out your arrival time, please call [phone] between 1PM - 3PM on: 07/08/23 - Monday If your arrival time is 6:00 am, do not arrive before that time as the [facility] entrance doors do not open until 6:00 am.  REMEMBER: Instructions that are not followed completely may result in serious medical risk, up to and including death; or upon the discretion of your surgeon and anesthesiologist your surgery may need to be rescheduled.  Do not eat food after midnight the night before surgery.  No gum chewing or hard candies.  You may however, drink CLEAR liquids up to 2 hours before you are scheduled to arrive for your surgery. Do not drink anything within 2 hours of your scheduled arrival time.  Clear liquids include: - water  - apple juice without pulp - gatorade (not RED colors) - black coffee or tea (Do NOT add milk or creamers to the coffee or tea) Do NOT drink anything that is not on this list.  You may continue these medications as needed,  Anti-inflammatories (NSAIDS) such as Advil, Aleve , Ibuprofen, Motrin, Naproxen , Naprosyn  and Aspirin based products such as Excedrin, Goody's Powder, BC Powder.  Stop ANY OVER THE COUNTER supplements until after surgery.  You may take Tylenol  if needed for pain up until the day of surgery.  Continue taking all of your other prescription medications up until the day of surgery.  ON THE DAY OF SURGERY ONLY TAKE THESE MEDICATIONS WITH SIPS OF WATER:  Oxycodone  if needed   No Alcohol for 24 hours before or after surgery.  No Smoking including e-cigarettes for 24 hours before surgery.  No chewable tobacco products for at least 6 hours before surgery.  No nicotine patches on the day of surgery.  Do not use any "recreational" drugs for at least a week (preferably 2 weeks) before your surgery.  Please be advised that  the combination of cocaine and anesthesia may have negative outcomes, up to and including death. If you test positive for cocaine, your surgery will be cancelled.  On the morning of surgery brush your teeth with toothpaste and water, you may rinse your mouth with mouthwash if you wish. Do not swallow any toothpaste or mouthwash.  Use CHG Soap or wipes as directed on instruction sheet.  Do not wear jewelry, make-up, hairpins, clips or nail polish.  For welded (permanent) jewelry: bracelets, anklets, waist bands, etc.  Please have this removed prior to surgery.  If it is not removed, there is a chance that hospital personnel will need to cut it off on the day of surgery.  Do not wear lotions, powders, or perfumes.   Do not shave body hair from the neck down 48 hours before surgery.  Contact lenses, hearing aids and dentures may not be worn into surgery.  Do not bring valuables to the hospital. [facility] is not responsible for any missing/lost belongings or valuables.   Notify your doctor if there is any change in your medical condition (cold, fever, infection).  Wear comfortable clothing (specific to your surgery type) to the hospital.  After surgery, you can help prevent lung complications by doing breathing exercises.  Take deep breaths and cough every 1-2 hours. Your doctor may order a device called an Incentive Spirometer to help you take deep breaths. When coughing or sneezing, hold a pillow firmly against your incision with  both hands. This is called "splinting." Doing this helps protect your incision. It also decreases belly discomfort.  If you are being admitted to the hospital overnight, leave your suitcase in the car. After surgery it may be brought to your room.  In case of increased patient census, it may be necessary for you, the patient, to continue your postoperative care in the Same Day Surgery department.  If you are being discharged the day of surgery, you will not  be allowed to drive home. You will need a responsible individual to drive you home and stay with you for 24 hours after surgery.   If you are taking public transportation, you will need to have a responsible individual with you.  Please call the Pre-admissions Testing Dept. at [phone] if you have any questions about these instructions.  Surgery Visitation Policy:  Patients having surgery or a procedure may have two visitors.  Children under the age of 37 must have an adult with them who is not the patient.  Inpatient Visitation:    Visiting hours are 7 a.m. to 8 p.m. Up to four visitors are allowed at one time in a patient room. The visitors may rotate out with other people during the day.  One visitor age 2 or older may stay with the patient overnight and must be in the room by 8 p.m.    Pre-operative 5 CHG Bath Instructions   You can play a key role in reducing the risk of infection after surgery. Your skin needs to be as free of germs as possible. You can reduce the number of germs on your skin by washing with CHG (chlorhexidine gluconate) soap before surgery. CHG is an antiseptic soap that kills germs and continues to kill germs even after washing.   DO NOT use if you have an allergy to chlorhexidine/CHG or antibacterial soaps. If your skin becomes reddened or irritated, stop using the CHG and notify one of our RNs at [phone].   Please shower with the CHG soap starting 4 days before surgery using the following schedule: 05/30 - 06/03.  Please keep in mind the following:  DO NOT shave, including legs and underarms, starting the day of your first shower.   You may shave your face at any point before/day of surgery.  Place clean sheets on your bed the day you start using CHG soap. Use a clean washcloth (not used since being washed) for each shower. DO NOT sleep with pets once you start using the CHG.   CHG Shower Instructions:  If you choose to wash your hair and  private area, wash first with your normal shampoo/soap.  After you use shampoo/soap, rinse your hair and body thoroughly to remove shampoo/soap residue.  Turn the water OFF and apply about 3 tablespoons (45 ml) of CHG soap to a CLEAN washcloth.  Apply CHG soap ONLY FROM YOUR NECK DOWN TO YOUR TOES (washing for 3-5 minutes)  DO NOT use CHG soap on face, private areas, open wounds, or sores.  Pay special attention to the area where your surgery is being performed.  If you are having back surgery, having someone wash your back for you may be helpful. Wait 2 minutes after CHG soap is applied, then you may rinse off the CHG soap.  Pat dry with a clean towel  Put on clean clothes/pajamas   If you choose to wear lotion, please use ONLY the CHG-compatible lotions on the back of this paper.  Additional instructions for the day of surgery: DO NOT APPLY any lotions, deodorants, cologne, or perfumes.   Put on clean/comfortable clothes.  Brush your teeth.  Ask your nurse before applying any prescription medications to the skin.      CHG Compatible Lotions   Aveeno Moisturizing lotion  Cetaphil Moisturizing Cream  Cetaphil Moisturizing Lotion  Clairol Herbal Essence Moisturizing Lotion, Dry Skin  Clairol Herbal Essence Moisturizing Lotion, Extra Dry Skin  Clairol Herbal Essence Moisturizing Lotion, Normal Skin  Curel Age Defying Therapeutic Moisturizing Lotion with Alpha Hydroxy  Curel Extreme Care Body Lotion  Curel Soothing Hands Moisturizing Hand Lotion  Curel Therapeutic Moisturizing Cream, Fragrance-Free  Curel Therapeutic Moisturizing Lotion, Fragrance-Free  Curel Therapeutic Moisturizing Lotion, Original Formula  Eucerin Daily Replenishing Lotion  Eucerin Dry Skin Therapy Plus Alpha Hydroxy Crme  Eucerin Dry Skin Therapy Plus Alpha Hydroxy Lotion  Eucerin Original Crme  Eucerin Original Lotion  Eucerin Plus Crme Eucerin Plus Lotion  Eucerin TriLipid Replenishing Lotion  Keri  Anti-Bacterial Hand Lotion  Keri Deep Conditioning Original Lotion Dry Skin Formula Softly Scented  Keri Deep Conditioning Original Lotion, Fragrance Free Sensitive Skin Formula  Keri Lotion Fast Absorbing Fragrance Free Sensitive Skin Formula  Keri Lotion Fast Absorbing Softly Scented Dry Skin Formula  Keri Original Lotion  Keri Skin Renewal Lotion Keri Silky Smooth Lotion  Keri Silky Smooth Sensitive Skin Lotion  Nivea Body Creamy Conditioning Oil  Nivea Body Extra Enriched Teacher, adult education Moisturizing Lotion Nivea Crme  Nivea Skin Firming Lotion  NutraDerm 30 Skin Lotion  NutraDerm Skin Lotion  NutraDerm Therapeutic Skin Cream  NutraDerm Therapeutic Skin Lotion  ProShield Protective Hand Cream  Provon moisturizing lotion

## 2023-06-27 NOTE — Telephone Encounter (Signed)
 PAT appointment has been rescheduled

## 2023-07-02 ENCOUNTER — Other Ambulatory Visit: Payer: Self-pay | Admitting: Physician Assistant

## 2023-07-02 ENCOUNTER — Telehealth: Payer: Self-pay | Admitting: Neurosurgery

## 2023-07-02 MED ORDER — HYDROMORPHONE HCL 2 MG PO TABS: 2.0000 mg | ORAL_TABLET | Freq: Four times a day (QID) | ORAL | 0 refills | Status: DC | PRN

## 2023-07-02 NOTE — Telephone Encounter (Signed)
 Patient notified

## 2023-07-02 NOTE — Telephone Encounter (Signed)
 Please let him know this has been corrected.

## 2023-07-02 NOTE — Telephone Encounter (Signed)
 Thoughts?

## 2023-07-02 NOTE — Progress Notes (Signed)
 Stopped percocet, Dilaudid  given

## 2023-07-02 NOTE — Telephone Encounter (Signed)
 Patient is calling to request something for pain. He states that he has finished the Oxycodone  and it did not really help with his pain. He also states that he is trying to keep working until he has surgery but needs something for pain.   [facility] [address]

## 2023-07-02 NOTE — Telephone Encounter (Signed)
 Please resend prescription to [facility] on [address].  I called [facility] and canceled the prescription there.

## 2023-07-05 ENCOUNTER — Encounter
Admission: RE | Admit: 2023-07-05 | Discharge: 2023-07-05 | Disposition: A | Discharge status: Home / Self Care | Source: Ambulatory Visit | Attending: Neurosurgery | Admitting: Neurosurgery

## 2023-07-05 DIAGNOSIS — Z01812 Encounter for preprocedural laboratory examination: Secondary | ICD-10-CM | POA: Diagnosis present

## 2023-07-05 DIAGNOSIS — R079 Chest pain, unspecified: Secondary | ICD-10-CM | POA: Insufficient documentation

## 2023-07-05 DIAGNOSIS — Z01818 Encounter for other preprocedural examination: Secondary | ICD-10-CM | POA: Diagnosis not present

## 2023-07-05 DIAGNOSIS — Z01810 Encounter for preprocedural cardiovascular examination: Secondary | ICD-10-CM | POA: Diagnosis present

## 2023-07-05 LAB — SURGICAL PCR SCREEN
MRSA, PCR: NEGATIVE
Staphylococcus aureus: NEGATIVE

## 2023-07-05 LAB — TYPE AND SCREEN
ABO/RH(D): A POS
Antibody Screen: NEGATIVE

## 2023-07-09 ENCOUNTER — Ambulatory Visit: Payer: Self-pay | Admitting: Urgent Care

## 2023-07-09 ENCOUNTER — Ambulatory Visit

## 2023-07-09 ENCOUNTER — Observation Stay
Admission: RE | Admit: 2023-07-09 | Discharge: 2023-07-09 | Disposition: A | Discharge status: Home / Self Care | Attending: Neurosurgery | Admitting: Neurosurgery

## 2023-07-09 ENCOUNTER — Encounter: Payer: Self-pay | Admitting: Neurosurgery

## 2023-07-09 ENCOUNTER — Other Ambulatory Visit: Payer: Self-pay

## 2023-07-09 ENCOUNTER — Encounter
Admission: RE | Disposition: A | Discharge status: Home / Self Care | Payer: Self-pay | Source: Home / Self Care | Attending: Neurosurgery

## 2023-07-09 DIAGNOSIS — F17210 Nicotine dependence, cigarettes, uncomplicated: Secondary | ICD-10-CM | POA: Diagnosis not present

## 2023-07-09 DIAGNOSIS — Z981 Arthrodesis status: Secondary | ICD-10-CM

## 2023-07-09 DIAGNOSIS — M4802 Spinal stenosis, cervical region: Secondary | ICD-10-CM | POA: Diagnosis not present

## 2023-07-09 DIAGNOSIS — M50322 Other cervical disc degeneration at C5-C6 level: Secondary | ICD-10-CM | POA: Insufficient documentation

## 2023-07-09 DIAGNOSIS — Z01818 Encounter for other preprocedural examination: Secondary | ICD-10-CM

## 2023-07-09 DIAGNOSIS — M4712 Other spondylosis with myelopathy, cervical region: Secondary | ICD-10-CM | POA: Diagnosis not present

## 2023-07-09 DIAGNOSIS — M4322 Fusion of spine, cervical region: Principal | ICD-10-CM | POA: Diagnosis present

## 2023-07-09 DIAGNOSIS — M50022 Cervical disc disorder at C5-C6 level with myelopathy: Secondary | ICD-10-CM | POA: Diagnosis not present

## 2023-07-09 HISTORY — PX: ANTERIOR CERVICAL DECOMP/DISCECTOMY FUSION: SHX1161

## 2023-07-09 LAB — ABO/RH: ABO/RH(D): A POS

## 2023-07-09 SURGERY — ANTERIOR CERVICAL DECOMPRESSION/DISCECTOMY FUSION 1 LEVEL
Anesthesia: General | Site: Spine Cervical

## 2023-07-09 MED ORDER — SODIUM CHLORIDE 0.9 % IV SOLN: 250.0000 mL | INTRAVENOUS | Status: DC

## 2023-07-09 MED ORDER — MENTHOL 3 MG MT LOZG: 1.0000 | LOZENGE | OROMUCOSAL | Status: DC | PRN

## 2023-07-09 MED ORDER — LACTATED RINGERS IV SOLN: INTRAVENOUS | Status: DC

## 2023-07-09 MED ORDER — CHLORHEXIDINE GLUCONATE 0.12 % MT SOLN
OROMUCOSAL | Status: AC
  Filled 2023-07-09: qty 15

## 2023-07-09 MED ORDER — ORAL CARE MOUTH RINSE
15.0000 mL | Freq: Once | OROMUCOSAL | Status: AC
Start: 2023-07-09 — End: 2023-07-09

## 2023-07-09 MED ORDER — PROPOFOL 1000 MG/100ML IV EMUL
INTRAVENOUS | Status: AC
  Filled 2023-07-09: qty 100

## 2023-07-09 MED ORDER — KETAMINE HCL 50 MG/5ML IJ SOSY
PREFILLED_SYRINGE | INTRAMUSCULAR | Status: DC | PRN
  Administered 2023-07-09: 30 mg via INTRAVENOUS

## 2023-07-09 MED ORDER — OXYCODONE HCL 5 MG PO TABS
10.0000 mg | ORAL_TABLET | ORAL | Status: DC | PRN
  Administered 2023-07-09: 10 mg via ORAL
  Filled 2023-07-09: qty 2

## 2023-07-09 MED ORDER — FENTANYL CITRATE (PF) 100 MCG/2ML IJ SOLN
INTRAMUSCULAR | Status: AC
  Filled 2023-07-09: qty 2

## 2023-07-09 MED ORDER — CEFAZOLIN IN SODIUM CHLORIDE 2-0.9 GM/100ML-% IV SOLN
2.0000 g | Freq: Once | INTRAVENOUS | Status: DC
  Filled 2023-07-09: qty 100

## 2023-07-09 MED ORDER — PHENOL 1.4 % MT LIQD
1.0000 | OROMUCOSAL | Status: DC | PRN
Start: 2023-07-09 — End: 2023-07-09

## 2023-07-09 MED ORDER — ACETAMINOPHEN 10 MG/ML IV SOLN
INTRAVENOUS | Status: AC
  Filled 2023-07-09: qty 100

## 2023-07-09 MED ORDER — ONDANSETRON HCL 4 MG/2ML IJ SOLN: 4.0000 mg | Freq: Four times a day (QID) | INTRAMUSCULAR | Status: DC | PRN

## 2023-07-09 MED ORDER — SODIUM CHLORIDE (PF) 0.9 % IJ SOLN
INTRAMUSCULAR | Status: AC
  Filled 2023-07-09: qty 20

## 2023-07-09 MED ORDER — OXYCODONE HCL 5 MG/5ML PO SOLN: 5.0000 mg | Freq: Once | ORAL | Status: AC | PRN

## 2023-07-09 MED ORDER — CHLORHEXIDINE GLUCONATE 0.12 % MT SOLN
15.0000 mL | Freq: Once | OROMUCOSAL | Status: AC
Start: 2023-07-09 — End: 2023-07-09
  Administered 2023-07-09: 15 mL via OROMUCOSAL

## 2023-07-09 MED ORDER — REMIFENTANIL HCL 1 MG IV SOLR
INTRAVENOUS | Status: AC
Start: 2023-07-09 — End: ?
  Filled 2023-07-09: qty 1000

## 2023-07-09 MED ORDER — DOCUSATE SODIUM 100 MG PO CAPS
100.0000 mg | ORAL_CAPSULE | Freq: Two times a day (BID) | ORAL | Status: DC
  Administered 2023-07-09: 100 mg via ORAL
  Filled 2023-07-09: qty 1

## 2023-07-09 MED ORDER — SURGIFLO WITH THROMBIN (HEMOSTATIC MATRIX KIT) OPTIME
TOPICAL | Status: DC | PRN
  Administered 2023-07-09: 1 via TOPICAL

## 2023-07-09 MED ORDER — CYCLOBENZAPRINE HCL 5 MG PO TABS: 5.0000 mg | ORAL_TABLET | Freq: Three times a day (TID) | ORAL | 0 refills | Status: AC | PRN

## 2023-07-09 MED ORDER — OXYCODONE HCL 5 MG PO TABS
5.0000 mg | ORAL_TABLET | Freq: Once | ORAL | Status: AC | PRN
  Administered 2023-07-09: 5 mg via ORAL

## 2023-07-09 MED ORDER — SODIUM CHLORIDE 0.9% FLUSH: 3.0000 mL | INTRAVENOUS | Status: DC | PRN

## 2023-07-09 MED ORDER — EPHEDRINE SULFATE-NACL 50-0.9 MG/10ML-% IV SOSY
PREFILLED_SYRINGE | INTRAVENOUS | Status: DC | PRN
  Administered 2023-07-09: 10 mg via INTRAVENOUS

## 2023-07-09 MED ORDER — DEXMEDETOMIDINE HCL IN NACL 80 MCG/20ML IV SOLN
INTRAVENOUS | Status: DC | PRN
  Administered 2023-07-09 (×3): 4 ug via INTRAVENOUS
  Administered 2023-07-09: 8 ug via INTRAVENOUS

## 2023-07-09 MED ORDER — SENNA 8.6 MG PO TABS: 1.0000 | ORAL_TABLET | Freq: Two times a day (BID) | ORAL | Status: DC

## 2023-07-09 MED ORDER — DIPHENHYDRAMINE HCL 50 MG/ML IJ SOLN
INTRAMUSCULAR | Status: AC
  Filled 2023-07-09: qty 1

## 2023-07-09 MED ORDER — METHOCARBAMOL 500 MG PO TABS
ORAL_TABLET | ORAL | Status: AC
  Filled 2023-07-09: qty 1

## 2023-07-09 MED ORDER — CEFAZOLIN SODIUM-DEXTROSE 2-4 GM/100ML-% IV SOLN
2.0000 g | INTRAVENOUS | Status: AC
  Administered 2023-07-09: 2 g via INTRAVENOUS

## 2023-07-09 MED ORDER — LIDOCAINE HCL (PF) 2 % IJ SOLN
INTRAMUSCULAR | Status: AC
  Filled 2023-07-09: qty 5

## 2023-07-09 MED ORDER — FENTANYL CITRATE (PF) 100 MCG/2ML IJ SOLN
INTRAMUSCULAR | Status: DC | PRN
  Administered 2023-07-09: 25 ug via INTRAVENOUS
  Administered 2023-07-09: 50 ug via INTRAVENOUS
  Administered 2023-07-09: 25 ug via INTRAVENOUS

## 2023-07-09 MED ORDER — ONDANSETRON HCL 4 MG/2ML IJ SOLN
INTRAMUSCULAR | Status: AC
  Filled 2023-07-09: qty 2

## 2023-07-09 MED ORDER — METHOCARBAMOL 500 MG PO TABS
500.0000 mg | ORAL_TABLET | Freq: Once | ORAL | Status: AC
  Administered 2023-07-09: 500 mg via ORAL

## 2023-07-09 MED ORDER — OXYCODONE-ACETAMINOPHEN 10-325 MG PO TABS: 1.0000 | ORAL_TABLET | Freq: Four times a day (QID) | ORAL | 0 refills | Status: DC | PRN

## 2023-07-09 MED ORDER — DEXAMETHASONE SODIUM PHOSPHATE 10 MG/ML IJ SOLN
INTRAMUSCULAR | Status: DC | PRN
  Administered 2023-07-09: 10 mg via INTRAVENOUS

## 2023-07-09 MED ORDER — SUCCINYLCHOLINE CHLORIDE 200 MG/10ML IV SOSY
PREFILLED_SYRINGE | INTRAVENOUS | Status: DC | PRN
  Administered 2023-07-09: 100 mg via INTRAVENOUS

## 2023-07-09 MED ORDER — FENTANYL CITRATE (PF) 100 MCG/2ML IJ SOLN
25.0000 ug | INTRAMUSCULAR | Status: DC | PRN
  Administered 2023-07-09 (×2): 50 ug via INTRAVENOUS

## 2023-07-09 MED ORDER — SODIUM CHLORIDE 0.9% FLUSH: 3.0000 mL | Freq: Two times a day (BID) | INTRAVENOUS | Status: DC

## 2023-07-09 MED ORDER — MIDAZOLAM HCL 2 MG/2ML IJ SOLN
INTRAMUSCULAR | Status: AC
  Filled 2023-07-09: qty 2

## 2023-07-09 MED ORDER — OXYCODONE HCL 5 MG PO TABS
ORAL_TABLET | ORAL | Status: AC
  Filled 2023-07-09: qty 1

## 2023-07-09 MED ORDER — PROPOFOL 10 MG/ML IV BOLUS
INTRAVENOUS | Status: AC
  Filled 2023-07-09: qty 40

## 2023-07-09 MED ORDER — BUPIVACAINE-EPINEPHRINE (PF) 0.5% -1:200000 IJ SOLN
INTRAMUSCULAR | Status: AC
  Filled 2023-07-09: qty 10

## 2023-07-09 MED ORDER — OXYCODONE HCL 5 MG PO TABS
ORAL_TABLET | ORAL | Status: AC
Start: 2023-07-09 — End: ?
  Filled 2023-07-09: qty 1

## 2023-07-09 MED ORDER — LIDOCAINE HCL 4 % EX SOLN
CUTANEOUS | Status: DC | PRN
Start: 2023-07-09 — End: 2023-07-09
  Administered 2023-07-09: 4 mL via TOPICAL

## 2023-07-09 MED ORDER — ACETAMINOPHEN 10 MG/ML IV SOLN: 1000.0000 mg | Freq: Once | INTRAVENOUS | Status: DC | PRN

## 2023-07-09 MED ORDER — KETAMINE HCL 50 MG/5ML IJ SOSY
PREFILLED_SYRINGE | INTRAMUSCULAR | Status: AC
  Filled 2023-07-09: qty 5

## 2023-07-09 MED ORDER — PHENYLEPHRINE HCL-NACL 20-0.9 MG/250ML-% IV SOLN
INTRAVENOUS | Status: DC | PRN
Start: 2023-07-09 — End: 2023-07-09
  Administered 2023-07-09: 30 ug/min via INTRAVENOUS

## 2023-07-09 MED ORDER — PROPOFOL 10 MG/ML IV BOLUS
INTRAVENOUS | Status: DC | PRN
  Administered 2023-07-09: 200 mg via INTRAVENOUS
  Administered 2023-07-09: 150 ug/kg/min via INTRAVENOUS

## 2023-07-09 MED ORDER — DROPERIDOL 2.5 MG/ML IJ SOLN: 0.6250 mg | Freq: Once | INTRAMUSCULAR | Status: DC | PRN

## 2023-07-09 MED ORDER — HYDROMORPHONE HCL 1 MG/ML IJ SOLN: 0.5000 mg | INTRAMUSCULAR | Status: DC | PRN

## 2023-07-09 MED ORDER — CEFAZOLIN SODIUM-DEXTROSE 2-4 GM/100ML-% IV SOLN
INTRAVENOUS | Status: AC
  Filled 2023-07-09: qty 100

## 2023-07-09 MED ORDER — DEXAMETHASONE SODIUM PHOSPHATE 10 MG/ML IJ SOLN
INTRAMUSCULAR | Status: AC
  Filled 2023-07-09: qty 1

## 2023-07-09 MED ORDER — DOCUSATE SODIUM 100 MG PO CAPS: 100.0000 mg | ORAL_CAPSULE | Freq: Two times a day (BID) | ORAL | 0 refills | Status: AC

## 2023-07-09 MED ORDER — ACETAMINOPHEN 500 MG PO TABS
1000.0000 mg | ORAL_TABLET | Freq: Four times a day (QID) | ORAL | Status: DC
Start: 2023-07-09 — End: 2023-07-09

## 2023-07-09 MED ORDER — SUCCINYLCHOLINE CHLORIDE 200 MG/10ML IV SOSY
PREFILLED_SYRINGE | INTRAVENOUS | Status: AC
  Filled 2023-07-09: qty 10

## 2023-07-09 MED ORDER — MIDAZOLAM HCL 2 MG/2ML IJ SOLN
INTRAMUSCULAR | Status: DC | PRN
  Administered 2023-07-09: 2 mg via INTRAVENOUS

## 2023-07-09 MED ORDER — ACETAMINOPHEN 10 MG/ML IV SOLN
INTRAVENOUS | Status: DC | PRN
  Administered 2023-07-09: 1000 mg via INTRAVENOUS

## 2023-07-09 MED ORDER — LIDOCAINE HCL (CARDIAC) PF 100 MG/5ML IV SOSY
PREFILLED_SYRINGE | INTRAVENOUS | Status: DC | PRN
  Administered 2023-07-09: 100 mg via INTRAVENOUS

## 2023-07-09 MED ORDER — ONDANSETRON HCL 4 MG/2ML IJ SOLN
INTRAMUSCULAR | Status: DC | PRN
Start: 2023-07-09 — End: 2023-07-09
  Administered 2023-07-09: 4 mg via INTRAVENOUS

## 2023-07-09 MED ORDER — BUPIVACAINE-EPINEPHRINE (PF) 0.5% -1:200000 IJ SOLN
INTRAMUSCULAR | Status: DC | PRN
  Administered 2023-07-09: 5 mL

## 2023-07-09 MED ORDER — GLYCOPYRROLATE 0.2 MG/ML IJ SOLN
INTRAMUSCULAR | Status: AC
Start: 2023-07-09 — End: ?
  Filled 2023-07-09: qty 1

## 2023-07-09 MED ORDER — OXYCODONE HCL 5 MG PO TABS
5.0000 mg | ORAL_TABLET | ORAL | Status: DC | PRN
  Administered 2023-07-09: 5 mg via ORAL

## 2023-07-09 MED ORDER — ONDANSETRON HCL 4 MG PO TABS: 4.0000 mg | ORAL_TABLET | Freq: Four times a day (QID) | ORAL | Status: DC | PRN

## 2023-07-09 MED ORDER — GLYCOPYRROLATE 0.2 MG/ML IJ SOLN
INTRAMUSCULAR | Status: DC | PRN
  Administered 2023-07-09: .1 mg via INTRAVENOUS

## 2023-07-09 MED ORDER — DIPHENHYDRAMINE HCL 50 MG/ML IJ SOLN
INTRAMUSCULAR | Status: DC | PRN
  Administered 2023-07-09: 12.5 mg via INTRAVENOUS

## 2023-07-09 MED ORDER — REMIFENTANIL HCL 1 MG IV SOLR
INTRAVENOUS | Status: DC | PRN
  Administered 2023-07-09: .1 ug/kg/min via INTRAVENOUS

## 2023-07-09 MED ORDER — 0.9 % SODIUM CHLORIDE (POUR BTL) OPTIME
TOPICAL | Status: DC | PRN
  Administered 2023-07-09: 500 mL

## 2023-07-09 SURGICAL SUPPLY — 42 items
ALLOGRAFT BONE FIBER KORE 1CC (Bone Implant) IMPLANT
BASIN KIT SINGLE STR (MISCELLANEOUS) ×1 IMPLANT
BIT DRILL ACP 13 (DRILL) IMPLANT
BRUSH SCRUB EZ 4% CHG (MISCELLANEOUS) ×1 IMPLANT
BUR NEURO DRILL SOFT 3.0X3.8M (BURR) ×1 IMPLANT
DERMABOND ADVANCED .7 DNX12 (GAUZE/BANDAGES/DRESSINGS) ×1 IMPLANT
DRAIN CHANNEL JP 10F RND 20C F (MISCELLANEOUS) IMPLANT
DRAPE C-ARM XRAY 36X54 (DRAPES) ×2 IMPLANT
DRAPE LAPAROTOMY 77X122 PED (DRAPES) ×1 IMPLANT
DRAPE MICROSCOPE SPINE 48X150 (DRAPES) ×1 IMPLANT
DRSG TEGADERM 2-3/8X2-3/4 SM (GAUZE/BANDAGES/DRESSINGS) ×1 IMPLANT
ELECTRODE REM PT RTRN 9FT ADLT (ELECTROSURGICAL) ×1 IMPLANT
EVACUATOR SILICONE 100CC (DRAIN) IMPLANT
FEE INTRAOP CADWELL SUPPLY NCS (MISCELLANEOUS) IMPLANT
FEE INTRAOP MONITOR IMPULS NCS (MISCELLANEOUS) IMPLANT
GLOVE BIOGEL PI IND STRL 7.0 (GLOVE) ×1 IMPLANT
GLOVE BIOGEL PI IND STRL 8 (GLOVE) ×1 IMPLANT
GLOVE SURG SYN 7.0 (GLOVE) ×1 IMPLANT
GLOVE SURG SYN 7.0 PF PI (GLOVE) ×1 IMPLANT
GLOVE SURG SYN 7.5 E (GLOVE) ×1 IMPLANT
GLOVE SURG SYN 7.5 PF PI (GLOVE) ×1 IMPLANT
GOWN SRG LRG LVL 4 IMPRV REINF (GOWNS) ×1 IMPLANT
GOWN STRL REUS W/ TWL XL LVL3 (GOWN DISPOSABLE) ×1 IMPLANT
KIT TURNOVER KIT A (KITS) ×1 IMPLANT
MANIFOLD NEPTUNE II (INSTRUMENTS) ×1 IMPLANT
NS IRRIG 500ML POUR BTL (IV SOLUTION) ×1 IMPLANT
PACK LAMINECTOMY ARMC (PACKS) ×1 IMPLANT
PAD ARMBOARD POSITIONER FOAM (MISCELLANEOUS) ×2 IMPLANT
PIN CASPAR 14 (PIN) ×1 IMPLANT
PIN CASPAR 14MM (PIN) ×1 IMPLANT
PLATE ACP 1.6X16 1LVL (Plate) IMPLANT
SCREW ACP VA ST 3.5X15 (Screw) IMPLANT
SPACER CERVICAL FRGE 12X14X5 (Spacer) IMPLANT
SPACER HEDRON C 12X14X5 7D (Spacer) IMPLANT
SPONGE KITTNER 5P (MISCELLANEOUS) ×1 IMPLANT
SURGIFLO W/THROMBIN 8M KIT (HEMOSTASIS) ×1 IMPLANT
SUT STRATA 3-0 30 PS-1 (SUTURE) IMPLANT
SUT VIC AB 3-0 SH 8-18 (SUTURE) ×1 IMPLANT
SYR 20ML LL LF (SYRINGE) ×1 IMPLANT
TAPE CLOTH 3X10 WHT NS LF (GAUZE/BANDAGES/DRESSINGS) ×3 IMPLANT
TRAP FLUID SMOKE EVACUATOR (MISCELLANEOUS) ×1 IMPLANT
TRAY FOLEY SLVR 16FR LF STAT (SET/KITS/TRAYS/PACK) IMPLANT

## 2023-07-09 NOTE — Discharge Instructions (Signed)
 Your surgeon has performed an operation on your cervical spine (neck) to relieve pressure on the spinal cord and/or nerves. This involved making an incision in the front of your neck and removing one or more of the discs that support your spine. Next, a small piece of bone, a titanium plate, and screws were used to fuse two or more of the vertebrae (bones) together.  The following are instructions to help in your recovery once you have been discharged from the hospital. Even if you feel well, it is important that you follow these activity guidelines. If you do not let your neck heal properly from the surgery, you can increase the chance of return of your symptoms and other complications.  * Do not take anti-inflammatory medications for 3 months after surgery (naproxen [Aleve], ibuprofen [Advil, Motrin], etc.). These medications can prevent your bones from healing properly.  Celebrex, if prescribed, is ok to take.  Activity    No bending, lifting, or twisting ("BLT"). Avoid lifting objects heavier than 10 pounds (gallon milk jug).  Where possible, avoid household activities that involve lifting, bending, reaching, pushing, or pulling such as laundry, vacuuming, grocery shopping, and childcare. Try to arrange for help from friends and family for these activities while your back heals.  Increase physical activity slowly as tolerated.  Taking short walks is encouraged, but avoid strenuous exercise. Do not jog, run, bicycle, lift weights, or participate in any other exercises unless specifically allowed by your doctor.  Talk to your doctor before resuming sexual activity.  You should not drive until cleared by your doctor.  Until released by your doctor, you should not return to work or school.  You should rest at home and let your body heal.   You may shower three days after your surgery.  After showering, lightly dab your incision dry. Do not take a tub bath or go swimming until approved by your  doctor at your follow-up appointment.  If your doctor ordered a cervical collar (neck brace) for you, you should wear it whenever you are out of bed. You may remove it when lying down or sleeping, but you should wear it at all other times. Not all neck surgeries require a cervical collar.  If you smoke, we strongly recommend that you quit.  Smoking has been proven to interfere with normal bone healing and will dramatically reduce the success rate of your surgery. Please contact [facility] (800-QUIT-NOW) and use the resources at www.[facility].com for assistance in stopping smoking.  Surgical Incision   If you have a dressing on your incision, you may remove it two days after your surgery. Keep your incision area clean and dry.  If you have staples or stitches on your incision, you should have a follow up scheduled for removal. If you do not have staples or stitches, you will have steri-strips (small pieces of surgical tape) or Dermabond glue. The steri-strips/glue should begin to peel away within about a week (it is fine if the steri-strips fall off before then). If the strips are still in place one week after your surgery, you may gently remove them.  Diet           You may return to your usual diet. However, you may experience discomfort when swallowing in the first month after your surgery. This is normal. You may find that softer foods are more comfortable for you to swallow. Be sure to stay hydrated.  When to Contact us  You may experience pain in your  neck and/or pain between your shoulder blades. This is normal and should improve in the next few weeks with the help of pain medication, muscle relaxers, and rest. Some patients report that a warm compress on the back of the neck or between the shoulder blades helps.  However, should you experience any of the following, contact us immediately: New numbness or weakness Pain that is progressively getting worse, and is not relieved by your pain  medication, muscle relaxers, rest, and warm compresses Bleeding, redness, swelling, pain, or drainage from surgical incision Chills or flu-like symptoms Fever greater than 101.0 F (38.3 C) Inability to eat, drink fluids, or take medications Problems with bowel or bladder functions Difficulty breathing or shortness of breath Warmth, tenderness, or swelling in your calf Contact Information How to contact us:  If you have any questions/concerns before or after surgery, you can reach [place] at [phone], or you can send a mychart message. We can be reached by phone or mychart 8am-4pm, Monday-Friday.  *Please note: Calls after 4pm are forwarded to a third party answering service. Mychart messages are not routinely monitored during evenings, weekends, and holidays. Please call our office to contact the answering service for urgent concerns during non-business hours.

## 2023-07-09 NOTE — Progress Notes (Signed)
 Discharge Summary for Benjamin Perry  Discharge Plan: Patient will be discharged home as per the MD's order. We discussed prescriptions and follow-up appointments with the patient. The prescriptions were provided, and the medication list was explained in detail. Patient confirmed understanding of the instructions.  Skin Assessment: The patient's skin is clean, dry, and intact, with no signs of breakdown or tears. The IV catheter was removed, and the skin remains intact. The site shows no signs or symptoms of complications. A dressing and pressure were applied to the site. Patient reports no pain and has no complaints.  After-Visit Summary: An After-Visit Summary was printed and given to the patient.   The patient was escorted via wheelchair and discharged home in a private vehicle.  Hanifa Antonetti D. Donnette Gal, RN

## 2023-07-09 NOTE — Interval H&P Note (Signed)
 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  The various methods of treatment have been discussed with the patient and family. After consideration of risks, benefits and other options for treatment, the patient has consented to  Procedure(s) with comments: ANTERIOR CERVICAL DECOMPRESSION/DISCECTOMY FUSION 1 LEVEL (N/A) - C5-6 ANTERIOR CERVICAL DISCECTOMY AND FUSION as a surgical intervention.  The patient's history has been reviewed, patient examined, no change in status, stable for surgery.  I have reviewed the patient's chart and labs.  Questions were answered to the patient's satisfaction.    He continues to worsen, has had multiple falls recently. Worsening pain, worsening lower extremity weaknss. Heart and lungs are clear.   Carroll Clamp

## 2023-07-09 NOTE — Anesthesia Procedure Notes (Signed)
 Procedure Name: Intubation Date/Time: 07/09/2023 9:42 AM  Performed by: Ian Maine, RNPre-anesthesia Checklist: Patient identified, Emergency Drugs available, Suction available and Patient being monitored Patient Re-evaluated:Patient Re-evaluated prior to induction Oxygen Delivery Method: Circle System Utilized Preoxygenation: Pre-oxygenation with 100% oxygen Induction Type: IV induction Ventilation: Mask ventilation without difficulty Laryngoscope Size: McGrath and 4 Grade View: Grade I Tube type: Oral Tube size: 7.5 mm Number of attempts: 1 Airway Equipment and Method: Stylet and Oral airway Placement Confirmation: ETT inserted through vocal cords under direct vision, positive ETCO2 and breath sounds checked- equal and bilateral Tube secured with: Tape Dental Injury: Teeth and Oropharynx as per pre-operative assessment

## 2023-07-09 NOTE — Anesthesia Postprocedure Evaluation (Signed)
 Anesthesia Post Note  Patient: Jered Heiny  Procedure(s) Performed: ANTERIOR CERVICAL DECOMPRESSION/DISCECTOMY FUSION 1 LEVEL (Spine Cervical)  Patient location during evaluation: PACU Anesthesia Type: General Level of consciousness: awake and alert Pain management: pain level controlled Vital Signs Assessment: post-procedure vital signs reviewed and stable Respiratory status: spontaneous breathing, nonlabored ventilation and respiratory function stable Cardiovascular status: blood pressure returned to baseline and stable Postop Assessment: no apparent nausea or vomiting Anesthetic complications: no   No notable events documented.   Last Vitals:  Vitals:   07/09/23 1245 07/09/23 1300  BP: 102/69 117/77  Pulse: 87 78  Resp: 16   Temp: (!) 36.2 C   SpO2: 98% 97%    Last Pain:  Vitals:   07/09/23 1242  TempSrc:   PainSc: 4                  Baltazar Bonier

## 2023-07-09 NOTE — Progress Notes (Signed)
 PT Cancellation Note  Patient Details Name: Benjamin Perry MRN: [id number] DOB: September 18, 1975   Cancelled Treatment:    Reason Eval/Treat Not Completed: PT screened, no needs identified, will sign off. Orders received and chart reviewed. Per OT pt independent with all mobility for OT assessment. No acute PT needs. PT to sign off.    Marc Senior. Fairly IV, PT, DPT Physical Therapist- [place]  [facility] 07/09/2023, 3:39 PM

## 2023-07-09 NOTE — Evaluation (Signed)
 Occupational Therapy Evaluation Patient Details Name: Benjamin Perry MRN: [id number] DOB: 12-28-1975 Today's Date: 07/09/2023   History of Present Illness   48 yo s/p C5-6 ACDF from progressive cervical myeloradiculopathy with progressive worsening of LUE function and BLE numbness and tingling. PMH: C6-T1 anterior cervical discectomy and fusion     Clinical Impressions Pt was seen for OT evaluation this date. PTA, pt reports living in a one level home with 3 STE with his family. 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 Reports 3 falls in the last month from his above symptoms. Pt reports improvement in all numbness and tingling in BLEs and LUE. He demo ability to manipulate items on his table, grip functionally and pour himself a cup of water. Currently pt is IND with all aspects of mobility, stair training and self care tasks. Pt educated in cervical precautions, self care skills, AE, and home/routines modifications to maximize safety and functional independence while minimizing falls risk and maintaining precautions. Pt verbalized understanding of all education/training provided. Able to return demonstration safe techniques while maintaining cervical precautions throughout. Handout provided to support recall and carry over of learned precautions/techniques for bed mobility, functional transfers, and self care skills. No additional skilled OT needs at this time. Will discharge in house with no further needs.     If plan is discharge home, recommend the following:   Assistance with cooking/housework     Functional Status Assessment   Patient has not had a recent decline in their functional status     Equipment Recommendations   None recommended by OT     Recommendations for Other Services         Precautions/Restrictions   Precautions Precautions: Cervical Precaution Booklet Issued: Yes (comment) Recall of Precautions/Restrictions:  Intact Restrictions Weight Bearing Restrictions Per Provider Order: No Other Position/Activity Restrictions: no lifting over 10 pounds     Mobility Bed Mobility Overal bed mobility: Modified Independent             General bed mobility comments: via log roll technique    Transfers Overall transfer level: Modified independent                 General transfer comment: no AD, STS and ambulation as well ascend/descend 3 steps with bil HR use      Balance Overall balance assessment: No apparent balance deficits (not formally assessed), Modified Independent                                         ADL either performed or assessed with clinical judgement   ADL Overall ADL's : At baseline                                       General ADL Comments: donned shirt, doffed hospital socks, donned new socks and shoes     Vision         Perception         Praxis         Pertinent Vitals/Pain Pain Assessment Pain Assessment: 0-10 Pain Score: 7  Pain Location: neck, lower back Pain Descriptors / Indicators: Sore Pain Intervention(s): Monitored during session, Repositioned     Extremity/Trunk Assessment Upper Extremity Assessment Upper Extremity Assessment: Overall WFL for tasks assessed   Lower  Extremity Assessment Lower Extremity Assessment: Overall WFL for tasks assessed       Communication Communication Communication: No apparent difficulties   Cognition Arousal: Alert Behavior During Therapy: WFL for tasks assessed/performed Cognition: No apparent impairments                               Following commands: Intact       Cueing  General Comments   Cueing Techniques: Verbal cues  incision intact, pt with soreness, no other c/o on eval   Exercises Other Exercises Other Exercises: Edu on role of OT in acute setting   Shoulder Instructions      Home Living Family/patient expects to be  discharged to:: Private residence Living Arrangements: Other relatives Available Help at Discharge: Family;Available 24 hours/day Type of Home: House Home Access: Stairs to enter [employer] of Steps: 3 Entrance Stairs-Rails: Can reach both Home Layout: One level     Bathroom Shower/Tub: Chief Strategy Officer: Standard     Home Equipment: None          Prior Functioning/Environment Prior Level of Function : Independent/Modified Independent;History of Falls (last six months);Working/employed             Mobility Comments: IND, 3 falls in the last month ADLs Comments: IND, was a Education administrator, did yard work    OT Problem List: Pain   OT Treatment/Interventions:        OT Goals(Current goals can be found in the care plan section)       OT Frequency:       Co-evaluation              AM-PAC OT "6 Clicks" Daily Activity     Outcome Measure Help from another person eating meals?: None Help from another person taking care of personal grooming?: None Help from another person toileting, which includes using toliet, bedpan, or urinal?: None Help from another person bathing (including washing, rinsing, drying)?: None Help from another person to put on and taking off regular upper body clothing?: None Help from another person to put on and taking off regular lower body clothing?: None 6 Click Score: 24   End of Session Equipment Utilized During Treatment: Gait belt Nurse Communication: Mobility status  Activity Tolerance: Patient tolerated treatment well Patient left: in bed;with call bell/phone within reach  OT Visit Diagnosis: Other abnormalities of gait and mobility (R26.89)                Time: 1610-9604 OT Time Calculation (min): 23 min Charges:  OT General Charges $OT Visit: 1 Visit OT Evaluation $OT Eval Low Complexity: 1 Low OT Treatments $Self Care/Home Management : 8-22 mins [name], OTR/L  07/09/23, 3:58 PM  [name]  [name] 07/09/2023, 3:55 PM

## 2023-07-09 NOTE — Anesthesia Preprocedure Evaluation (Addendum)
 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, cervical, with myelopathy    negative psych ROS   GI/Hepatic negative GI ROS, Neg liver ROS,,,  Endo/Other  negative endocrine ROS    Renal/GU      Musculoskeletal   Abdominal Normal abdominal exam  (+)   Peds  Hematology negative hematology ROS (+)   Anesthesia Other Findings Past Medical History: 2023: Atypical chest pain 2025: Cervical disc herniation 2025: Foraminal stenosis of cervical region  Past Surgical History: 2016: NECK SURGERY  BMI    Body Mass Index: 27.81 kg/m      Reproductive/Obstetrics negative OB ROS                             Anesthesia Physical Anesthesia Plan  ASA: 2  Anesthesia Plan: General ETT   Post-op Pain Management: Toradol  IV (intra-op)* and Ofirmev  IV (intra-op)*   Induction: Intravenous  PONV Risk Score and Plan: 2 and Ondansetron , Dexamethasone  and Midazolam  Airway Management Planned: Oral ETT  Additional Equipment:   Intra-op Plan:   Post-operative Plan: Extubation in OR  Informed Consent: I have reviewed the patients History and Physical, chart, labs and discussed the procedure including the risks, benefits and alternatives for the proposed anesthesia with the patient or authorized representative who has  indicated his/her understanding and acceptance.     Dental Advisory Given  Plan Discussed with: CRNA and Surgeon  Anesthesia Plan Comments:         Anesthesia Quick Evaluation

## 2023-07-09 NOTE — Op Note (Signed)
 Indications: Patient with a history of a previous anterior cervical fusion with adjacent level disease now at C5-6 with a severe C6 radiculopathy.  He has progressive weakness and myelopathy.  Given his progressive symptoms and cord compression noted he was taken to the operating room for C5-6 anterior cervical fusion/extension of fusion  Findings: Disc herniation noted at the C5-6 level causing ventral compression, well decompressed at the end of the procedure.    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   Preoperative Note:   Risks of surgery discussed include: infection, bleeding, stroke, coma, death, paralysis, CSF leak, nerve/spinal cord injury, numbness, tingling, weakness, complex regional pain syndrome, recurrent stenosis and/or disc herniation, vascular injury, development of instability, neck/back pain, need for further surgery, persistent symptoms, development of deformity, and the risks of anesthesia. The patient understood these risks and agreed to proceed.  Procedure:  1) Anterior cervical diskectomy and fusion at C5-6 2) Anterior cervical instrumentation at C5-6 3) Structural allograft consisting of corticocancellous allograft   Procedure: After obtaining informed consent, the patient taken to the operating room, placed in supine position, general anesthesia induced.  The patient had a small shoulder roll placed behind their shoulders.  The patient  received preop antibiotics and IV Decadron .  The patient had a neck incision outlined, was prepped and draped in usual sterile fashion. The incision was injected with local anesthetic.   An incision was opened, dissection taken down medial to the carotid artery and jugular vein, lateral to the trachea and esophagus.  The prevertebral fascia identified and a localizing x-ray demonstrated the correct level.  We utilized both counting and his previous hardware placement for verification.  The longus colli were dissected laterally, and self-retaining retractors placed to open the operative field he had large osteophytic overgrowth which impeded our ability to place the retractors in standard fashion.  They were slightly superficial but still retracting the soft tissues. The microscope was then brought into the field.  We were able to identify the disc space.  It was opened with a Bovie cautery, it was removed in the usual fashion as follows.  Curettes and pituitary rongeurs used to remove the majority of disk, then the drill was used to remove the posterior osteophyte and begin the foraminotomies. The nerve hook was used to elevate the posterior longitudinal ligament, which was then removed with Kerrison rongeurs. The microblunt nerve hook could be passed out the foramen bilaterally.   Meticulous hemostasis obtained.  Structural allograft was tapped behind the anterior lip of the vertebral body at C5/6 (5 mm).    Two screws placed in each vertebral body, respectively making sure the screws were behind the locking mechanism.  Final AP and lateral radiographs were taken.   With everything in good position, the wound was irrigated copiously and meticulous hemostasis obtained.  Wound was closed in 2 layers using interrupted inverted 3-0 Vicryl sutures.  The wound was dressed with dermabond, the head of bed at 30 degrees, taken to recovery room in stable condition.  No new postop neurological deficits were  identified.  Sponge and pattie counts were correct at the end of the procedure.   I performed this procedure without an assistant surgeon  Implants: Implant Name Type Inv. Item Serial No. Manufacturer Lot No. LRB No. Used Action  SPACER CERVICAL FRGE [id number] - [id number] Spacer SPACER CERVICAL FRGE [id number] [employer]  N/A 1 Implanted  PLATE ACP 9.5A21 1LVL - [id number] Plate PLATE ACP 6.2X52 1LVL  [employer]  N/A 1 Implanted  SCREW ACP VA ST 3.5X15 - [id number] Screw SCREW ACP VA ST 3.5X15  [employer]  N/A 4 Implanted      Carroll Clamp, MD/MSCR

## 2023-07-09 NOTE — Discharge Summary (Signed)
 [facility] Discharge Summary   Patient: Benjamin Perry ZOX:[id number] DOB: 02-07-1975 PCP: [facility]  Date of admission: 07/09/2023  Date of Surgery: 07/09/2023  Surgery: Procedure(s) (LRB): ANTERIOR CERVICAL DECOMPRESSION/DISCECTOMY FUSION 1 LEVEL (N/A)  Date of discharge:  * Day of Surgery *  Discharge Condition: Good  Recommendations at discharge:  Follow up as scheduled  Discharge Diagnoses: Principal Problem:   Cervical vertebral fusion  Resolved Problems:   * No resolved hospital problems. [facility] Course: The patient underwent Procedure(s) (LRB): ANTERIOR CERVICAL DECOMPRESSION/DISCECTOMY FUSION 1 LEVEL (N/A) on 07/09/2023 for Principal Problem:   Cervical vertebral fusion .  They were admitted to obs postoperatively.  They are cleared for dc.   Consultants: OT  Additional Procedures: none  Exam: Vitals:   07/09/23 1452  BP: 111/66  Pulse: 89  Resp: 15  Temp: 97.8 F (36.6 C)  SpO2: 98%   The physical exam is generally normal. Patient appears well, alert and oriented x 3, pleasant, cooperative. Vitals are as noted. Abdomen is soft, no tenderness. Extremities are normal.  Neurological exam is at baseline without new deficits.   The results of significant diagnostics from this hospitalization (including imaging, microbiology, ancillary and laboratory) are listed below for reference.  Imaging Studies: DG Cervical Spine 2-3 Views Result Date: 07/09/2023 CLINICAL DATA:  C5-6 anterior cervical discectomy and fusion EXAM: CERVICAL SPINE - 2 VIEW COMPARISON:  None Available. FINDINGS: Two fluoroscopic images obtained during C5-6 anterior discectomy and fusion. 9 seconds fluoro time utilized. Radiation dose 1.09 mGy Kerma. Please see performing physicians operative report for full details. IMPRESSION: Fluoroscopic images were obtained for intraoperative guidance of C5-6 anterior discectomy and fusion. Electronically Signed   By: Limin  Xu  M.D.   On: 07/09/2023 15:22   DG C-Arm 1-60 Min-No Report Result Date: 07/09/2023 Fluoroscopy was utilized by the requesting physician.  No radiographic interpretation.   DG C-Arm 1-60 Min-No Report Result Date: 07/09/2023 Fluoroscopy was utilized by the requesting physician.  No radiographic interpretation.   DG Cervical Spine Complete Result Date: 06/22/2023 CLINICAL DATA:  Evaluate for excess motion EXAM: CERVICAL SPINE - COMPLETE 4+ VIEW COMPARISON:  None Available. FINDINGS: Status post ACDF with intervertebral spacer with mature osseous fusion of C6-T1. Orthopedic hardware is intact and without periprosthetic fracture or lucency. The cervical spine is visualized from C1-C7. Straightening of the cervical lordosis. No evidence of dynamic instability on flexion and extension images. Vertebral body heights are maintained: no evidence of acute fracture. Mild intervertebral disc space height loss with endplate proliferative changes C5-C6. No prevertebral soft tissue swelling. Visualized thorax is unremarkable. IMPRESSION: 1. Status post ACDF of C6-T1 without evidence of hardware complication. 2. Mild degenerative changes at C5-C6. Electronically Signed   By: Clancy Crimes M.D.   On: 06/22/2023 21:59   MR Cervical Spine Wo Contrast Result Date: 06/16/2023 CLINICAL DATA:  Bilateral leg numbness and tingling EXAM: MRI CERVICAL, THORACIC AND LUMBAR SPINE WITHOUT CONTRAST TECHNIQUE: Multiplanar and multiecho pulse sequences of the cervical spine, to include the craniocervical junction and cervicothoracic junction, and thoracic and lumbar spine, were obtained without intravenous contrast. COMPARISON:  Cervical MRI report 02/19/2014 FINDINGS: MRI CERVICAL SPINE FINDINGS Alignment: Straightening of cervical lordosis Vertebrae: C6-7 and C7-T1 ACDF with solid arthrodesis by confluent marrow. No fracture or bone lesion. Cord: T2 hyperintensity with thinning in the left paramedian cord at the level of C6-7, where  compression and cord signal abnormality was described on prior. No cord swelling or edema seen. Posterior  Fossa, vertebral arteries, paraspinal tissues: No perispinal mass or inflammation Disc levels: C2-3: Unremarkable. C3-4: Unremarkable. C4-5: Disc narrowing and bulging with uncovertebral ridging. Moderate bilateral foraminal stenosis. C5-6: Disc narrowing and bulging with left paracentral protrusion contacting the ventral cord. Uncovertebral spurring causes advanced right foraminal stenosis. C6-7: ACDF with no residual impingement C7-T1:ACDF with no residual impingement MRI THORACIC SPINE FINDINGS Alignment:  Physiologic. Vertebrae: No fracture, evidence of discitis, or bone lesion. Minimal edematous signal in the inferior endplate of T9 without fracture deformity, likely degenerative. Cord:  Normal signal and morphology. Paraspinal and other soft tissues: No evidence of perispinal mass or inflammation. Disc levels: Mild disc desiccation with tiny right paracentral protrusion at T8-9. No neural compression or significant facet spurring throughout the thoracic spine. MRI LUMBAR SPINE FINDINGS Segmentation: Transitional lumbosacral vertebra numbered S1 from above. Alignment:  Physiologic. Vertebrae:  No fracture, evidence of discitis, or bone lesion. Conus medullaris and cauda equina: Conus extends to the L2 level. Conus and cauda equina appear normal. Paraspinal and other soft tissues: Mild periarticular edematous signal suggested at the right L4-5 facet Disc levels: L2-L3: Disc desiccation and narrowing with bulge and ventral endplate spurring. Degenerative facet spurring. No neural compression. L3-L4: Disc bulging which is primarily far lateral. Mild facet spurring. No neural compression. L4-L5: Annulus bulging with small right foraminal protrusion. Degenerative facet spurring. Mild narrowing of the subarticular recesses. L5-S1:Disc bulging, facet spurring, ligamentum flavum thickening, and short pedicles  causes advanced spinal stenosis. Right foraminal protrusion and annular fissure with moderate right foraminal impingement. IMPRESSION: Cervical spine: Uncomplicated C6-T1 ACDF with solid arthrodesis. 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 C5-6 adjacent segment degeneration with advanced right foraminal impingement and left paracentral protrusion contacting the ventral cord. C4-5 moderate bilateral foraminal narrowing. Thoracic spine: Unremarkable. Lumbar spine: Transitional lumbosacral vertebra numbered S1 from above. Generalized disc bulging and facet spurring with advanced spinal stenosis at L5-S1. Moderate right foraminal impingement at the same level. Electronically Signed   By: Ronnette Coke M.D.   On: 06/16/2023 09:08   MR THORACIC SPINE WO CONTRAST Result Date: 06/16/2023 CLINICAL DATA:  Bilateral leg numbness and tingling EXAM: MRI CERVICAL, THORACIC AND LUMBAR SPINE WITHOUT CONTRAST TECHNIQUE: Multiplanar and multiecho pulse sequences of the cervical spine, to include the craniocervical junction and cervicothoracic junction, and thoracic and lumbar spine, were obtained without intravenous contrast. COMPARISON:  Cervical MRI report 02/19/2014 FINDINGS: MRI CERVICAL SPINE FINDINGS Alignment: Straightening of cervical lordosis Vertebrae: C6-7 and C7-T1 ACDF with solid arthrodesis by confluent marrow. No fracture or bone lesion. Cord: T2 hyperintensity with thinning in the left paramedian cord at the level of C6-7, where compression and cord signal abnormality was described on prior. No cord swelling or edema seen. Posterior Fossa, vertebral arteries, paraspinal tissues: No perispinal mass or inflammation Disc levels: C2-3: Unremarkable. C3-4: Unremarkable. C4-5: Disc narrowing and bulging with uncovertebral ridging. Moderate bilateral foraminal stenosis. C5-6: Disc narrowing and bulging with left paracentral protrusion contacting the ventral cord. Uncovertebral spurring causes advanced right  foraminal stenosis. C6-7: ACDF with no residual impingement C7-T1:ACDF with no residual impingement MRI THORACIC SPINE FINDINGS Alignment:  Physiologic. Vertebrae: No fracture, evidence of discitis, or bone lesion. Minimal edematous signal in the inferior endplate of T9 without fracture deformity, likely degenerative. Cord:  Normal signal and morphology. Paraspinal and other soft tissues: No evidence of perispinal mass or inflammation. Disc levels: Mild disc desiccation with tiny right paracentral protrusion at T8-9. No neural compression or significant facet spurring throughout the thoracic spine. MRI LUMBAR  SPINE FINDINGS Segmentation: Transitional lumbosacral vertebra numbered S1 from above. Alignment:  Physiologic. Vertebrae:  No fracture, evidence of discitis, or bone lesion. Conus medullaris and cauda equina: Conus extends to the L2 level. Conus and cauda equina appear normal. Paraspinal and other soft tissues: Mild periarticular edematous signal suggested at the right L4-5 facet Disc levels: L2-L3: Disc desiccation and narrowing with bulge and ventral endplate spurring. Degenerative facet spurring. No neural compression. L3-L4: Disc bulging which is primarily far lateral. Mild facet spurring. No neural compression. L4-L5: Annulus bulging with small right foraminal protrusion. Degenerative facet spurring. Mild narrowing of the subarticular recesses. L5-S1:Disc bulging, facet spurring, ligamentum flavum thickening, and short pedicles causes advanced spinal stenosis. Right foraminal protrusion and annular fissure with moderate right foraminal impingement. IMPRESSION: Cervical spine: Uncomplicated C6-T1 ACDF with solid arthrodesis. 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 C5-6 adjacent segment degeneration with advanced right foraminal impingement and left paracentral protrusion contacting the ventral cord. C4-5 moderate bilateral foraminal narrowing. Thoracic spine: Unremarkable. Lumbar spine:  Transitional lumbosacral vertebra numbered S1 from above. Generalized disc bulging and facet spurring with advanced spinal stenosis at L5-S1. Moderate right foraminal impingement at the same level. Electronically Signed   By: Ronnette Coke M.D.   On: 06/16/2023 09:08   MR LUMBAR SPINE WO CONTRAST Result Date: 06/16/2023 CLINICAL DATA:  Bilateral leg numbness and tingling EXAM: MRI CERVICAL, THORACIC AND LUMBAR SPINE WITHOUT CONTRAST TECHNIQUE: Multiplanar and multiecho pulse sequences of the cervical spine, to include the craniocervical junction and cervicothoracic junction, and thoracic and lumbar spine, were obtained without intravenous contrast. COMPARISON:  Cervical MRI report 02/19/2014 FINDINGS: MRI CERVICAL SPINE FINDINGS Alignment: Straightening of cervical lordosis Vertebrae: C6-7 and C7-T1 ACDF with solid arthrodesis by confluent marrow. No fracture or bone lesion. Cord: T2 hyperintensity with thinning in the left paramedian cord at the level of C6-7, where compression and cord signal abnormality was described on prior. No cord swelling or edema seen. Posterior Fossa, vertebral arteries, paraspinal tissues: No perispinal mass or inflammation Disc levels: C2-3: Unremarkable. C3-4: Unremarkable. C4-5: Disc narrowing and bulging with uncovertebral ridging. Moderate bilateral foraminal stenosis. C5-6: Disc narrowing and bulging with left paracentral protrusion contacting the ventral cord. Uncovertebral spurring causes advanced right foraminal stenosis. C6-7: ACDF with no residual impingement C7-T1:ACDF with no residual impingement MRI THORACIC SPINE FINDINGS Alignment:  Physiologic. Vertebrae: No fracture, evidence of discitis, or bone lesion. Minimal edematous signal in the inferior endplate of T9 without fracture deformity, likely degenerative. Cord:  Normal signal and morphology. Paraspinal and other soft tissues: No evidence of perispinal mass or inflammation. Disc levels: Mild disc desiccation with  tiny right paracentral protrusion at T8-9. No neural compression or significant facet spurring throughout the thoracic spine. MRI LUMBAR SPINE FINDINGS Segmentation: Transitional lumbosacral vertebra numbered S1 from above. Alignment:  Physiologic. Vertebrae:  No fracture, evidence of discitis, or bone lesion. Conus medullaris and cauda equina: Conus extends to the L2 level. Conus and cauda equina appear normal. Paraspinal and other soft tissues: Mild periarticular edematous signal suggested at the right L4-5 facet Disc levels: L2-L3: Disc desiccation and narrowing with bulge and ventral endplate spurring. Degenerative facet spurring. No neural compression. L3-L4: Disc bulging which is primarily far lateral. Mild facet spurring. No neural compression. L4-L5: Annulus bulging with small right foraminal protrusion. Degenerative facet spurring. Mild narrowing of the subarticular recesses. L5-S1:Disc bulging, facet spurring, ligamentum flavum thickening, and short pedicles causes advanced spinal stenosis. Right foraminal protrusion and annular fissure with moderate right foraminal impingement. IMPRESSION: Cervical  spine: Uncomplicated C6-T1 ACDF with solid arthrodesis. 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 C5-6 adjacent segment degeneration with advanced right foraminal impingement and left paracentral protrusion contacting the ventral cord. C4-5 moderate bilateral foraminal narrowing. Thoracic spine: Unremarkable. Lumbar spine: Transitional lumbosacral vertebra numbered S1 from above. Generalized disc bulging and facet spurring with advanced spinal stenosis at L5-S1. Moderate right foraminal impingement at the same level. Electronically Signed   By: Ronnette Coke M.D.   On: 06/16/2023 09:08   MR BRAIN WO CONTRAST Result Date: 06/16/2023 CLINICAL DATA:  Neuro deficit, acute stroke suspected. Bilateral leg numbness and tingling from the sacrum to the feet. EXAM: MRI HEAD WITHOUT CONTRAST TECHNIQUE:  Multiplanar, multiecho pulse sequences of the brain and surrounding structures were obtained without intravenous contrast. COMPARISON:  None Available. FINDINGS: Brain: No acute infarction, hemorrhage, hydrocephalus, or mass lesion. 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 No significant cerebellar mass effect. No brain atrophy. There may be 2 small remote white matter insults, commonly seen to this degree in healthy patients of this age - no generalized white matter disease/demyelinating pattern. Vascular: Normal flow voids. Skull and upper cervical spine: Normal marrow signal. Sinuses/Orbits: Negative. IMPRESSION: No acute finding or explanation for symptoms. Electronically Signed   By: Ronnette Coke M.D.   On: 06/16/2023 08:53    Microbiology: Results for orders placed or performed during the hospital encounter of 07/05/23  Surgical pcr screen     Status: None   Collection Time: 07/05/23  1:33 PM   Specimen: Nasal Mucosa; Nasal Swab  Result Value Ref Range Status   MRSA, PCR NEGATIVE NEGATIVE Final   Staphylococcus aureus NEGATIVE NEGATIVE Final    Comment: (NOTE) The Xpert SA Assay (FDA approved for NASAL specimens in patients 7 years of age and older), is one component of a comprehensive surveillance program. It is not intended to diagnose infection nor to guide or monitor treatment. Performed at [facility], [address] [place], [place] [address]     Labs:  CBC: No results for input(s): "WBC", "NEUTROABS", "HGB", "HCT", "MCV", "PLT" in the last 168 hours. Basic Metabolic Panel: No results for input(s): "NA", "K", "CL", "CO2", "GLUCOSE", "BUN", "CREATININE", "CALCIUM", "MG", "PHOS" in the last 168 hours. CBG: No results for input(s): "GLUCAP" in the last 168 hours.  Time spent: 35 minutes  Author: Carroll Clamp, MD  [facility]

## 2023-07-09 NOTE — Transfer of Care (Signed)
 Immediate Anesthesia Transfer of Care Note  Patient: Rasmus Preusser  Procedure(s) Performed: ANTERIOR CERVICAL DECOMPRESSION/DISCECTOMY FUSION 1 LEVEL (Spine Cervical)  Patient Location: PACU  Anesthesia Type:General  Level of Consciousness: drowsy and patient cooperative  Airway & Oxygen Therapy: Patient Spontanous Breathing and Patient connected to face mask oxygen  Post-op Assessment: Report given to RN, Post -op Vital signs reviewed and stable, and Patient moving all extremities X 4  Post vital signs: Reviewed and stable  Last Vitals:  Vitals Value Taken Time  BP 121/59 07/09/23 1200  Temp    Pulse 87 07/09/23 1202  Resp 9 07/09/23 1202  SpO2 98 % 07/09/23 1202  Vitals shown include unfiled device data.  Last Pain:  Vitals:   07/09/23 0721  TempSrc: Temporal         Complications: No notable events documented.

## 2023-07-11 ENCOUNTER — Telehealth: Payer: Self-pay | Admitting: Neurosurgery

## 2023-07-11 ENCOUNTER — Encounter: Payer: Self-pay | Admitting: Neurosurgery

## 2023-07-11 DIAGNOSIS — M50322 Other cervical disc degeneration at C5-C6 level: Secondary | ICD-10-CM

## 2023-07-11 DIAGNOSIS — Z981 Arthrodesis status: Secondary | ICD-10-CM

## 2023-07-11 DIAGNOSIS — M4712 Other spondylosis with myelopathy, cervical region: Secondary | ICD-10-CM

## 2023-07-11 MED ORDER — OXYCODONE-ACETAMINOPHEN 10-325 MG PO TABS: 1.0000 | ORAL_TABLET | Freq: Four times a day (QID) | ORAL | 0 refills | Status: DC | PRN

## 2023-07-11 NOTE — Telephone Encounter (Signed)
 Patient took his first pill of percocet last night after being discharged yesterday. Patient is in increased pain noting that he did not sleep well last night.   Advised patient that he can take a daily max of 4,000mg  of tylenol  including each percocet tablet having 325mg .  He is taking flexeril  as prescribed States he was advised by a pharmacist that he could take PERCOCET every 4 instead of every 6 hours. I advised him that is not what Dr. Felipe Horton advises for this medication. He was educated on the importance of taking medications as prescribed.   We discussed the importance of staying on a schedule if he is on pain to avoid playing catch up with pain to see if this can help him be more comfortable especially overnight.   Patient got a 5 day supply of PERCOCET and is due for a refill on Sunday, I educated him that the pharmacy will not refill the medication until he is due to be out.   Advised patient that I would forward his message to a provider for refill and that I would notify him when this was sent in.

## 2023-07-11 NOTE — Telephone Encounter (Signed)
 Patient notified that refill has been sent in for pick up on 07/14/2023.

## 2023-07-11 NOTE — Telephone Encounter (Signed)
 C5-6 ACDF on 07/09/23   Patient is calling to request a refill of Percocet. He states that he wants to make sure that he has enough to get him through the weekend. He states that he has been having right arm and neck pain and has not been able to get much rest since surgery. He also wants to know if he is able to take a warm shower as long as he does not get his incision wet. Please advise.   [facility] on [address]

## 2023-07-11 NOTE — Telephone Encounter (Signed)
 DOS: 07/09/23 ACDF C5-C6  Agree he should take percocet q 6 hours as prescribed along with prn flexeril . PMP reviewed and is appropriate.   Dr. Felipe Horton gave him #28 pills but pharmacy only have him #20 per PMP and dispensing history in chart. This is a 5 day supply. PMP reviewed and is appropriate.   Will send percocet refill to be picked up on 07/14/23. Please let him know.

## 2023-07-18 ENCOUNTER — Emergency Department

## 2023-07-18 ENCOUNTER — Other Ambulatory Visit: Payer: Self-pay

## 2023-07-18 ENCOUNTER — Emergency Department
Admission: EM | Admit: 2023-07-18 | Discharge: 2023-07-18 | Disposition: A | Discharge status: Home / Self Care | Attending: Emergency Medicine | Admitting: Emergency Medicine

## 2023-07-18 ENCOUNTER — Telehealth: Payer: Self-pay

## 2023-07-18 DIAGNOSIS — R140 Abdominal distension (gaseous): Secondary | ICD-10-CM | POA: Diagnosis present

## 2023-07-18 DIAGNOSIS — R932 Abnormal findings on diagnostic imaging of liver and biliary tract: Secondary | ICD-10-CM | POA: Diagnosis not present

## 2023-07-18 DIAGNOSIS — K5900 Constipation, unspecified: Secondary | ICD-10-CM | POA: Diagnosis not present

## 2023-07-18 DIAGNOSIS — T402X5A Adverse effect of other opioids, initial encounter: Secondary | ICD-10-CM

## 2023-07-18 DIAGNOSIS — K5641 Fecal impaction: Secondary | ICD-10-CM | POA: Diagnosis not present

## 2023-07-18 DIAGNOSIS — R1084 Generalized abdominal pain: Secondary | ICD-10-CM | POA: Diagnosis not present

## 2023-07-18 DIAGNOSIS — R000 Tachycardia, unspecified: Secondary | ICD-10-CM | POA: Diagnosis not present

## 2023-07-18 LAB — CBC
HCT: 49 % (ref 39.0–52.0)
Hemoglobin: 16.6 g/dL (ref 13.0–17.0)
MCH: 30.9 pg (ref 26.0–34.0)
MCHC: 33.9 g/dL (ref 30.0–36.0)
MCV: 91.2 fL (ref 80.0–100.0)
Platelets: 472 10*3/uL — ABNORMAL HIGH (ref 150–400)
RBC: 5.37 MIL/uL (ref 4.22–5.81)
RDW: 12 % (ref 11.5–15.5)
WBC: 20.7 10*3/uL — ABNORMAL HIGH (ref 4.0–10.5)
nRBC: 0 % (ref 0.0–0.2)

## 2023-07-18 LAB — COMPREHENSIVE METABOLIC PANEL WITH GFR
ALT: 44 U/L (ref 0–44)
AST: 52 U/L — ABNORMAL HIGH (ref 15–41)
Albumin: 4.3 g/dL (ref 3.5–5.0)
Alkaline Phosphatase: 81 U/L (ref 38–126)
Anion gap: 13 (ref 5–15)
BUN: 9 mg/dL (ref 6–20)
CO2: 25 mmol/L (ref 22–32)
Calcium: 9.9 mg/dL (ref 8.9–10.3)
Chloride: 94 mmol/L — ABNORMAL LOW (ref 98–111)
Creatinine, Ser: 0.9 mg/dL (ref 0.61–1.24)
GFR, Estimated: >60 mL/min (ref >60)
Glucose, Bld: 169 mg/dL — ABNORMAL HIGH (ref 70–99)
Potassium: 4.8 mmol/L (ref 3.5–5.1)
Sodium: 132 mmol/L — ABNORMAL LOW (ref 135–145)
Total Bilirubin: 0.6 mg/dL (ref 0.0–1.2)
Total Protein: 8.4 g/dL — ABNORMAL HIGH (ref 6.5–8.1)

## 2023-07-18 LAB — LIPASE, BLOOD: Lipase: 25 U/L (ref 11–51)

## 2023-07-18 MED ORDER — MINERAL OIL RE ENEM
1.0000 | ENEMA | Freq: Once | RECTAL | Status: AC
  Administered 2023-07-18: 1 via RECTAL

## 2023-07-18 MED ORDER — FENTANYL CITRATE PF 50 MCG/ML IJ SOSY
50.0000 ug | PREFILLED_SYRINGE | Freq: Once | INTRAMUSCULAR | Status: AC
  Administered 2023-07-18: 50 ug via INTRAVENOUS
  Filled 2023-07-18: qty 1

## 2023-07-18 MED ORDER — SODIUM CHLORIDE 0.9 % IV BOLUS
1000.0000 mL | Freq: Once | INTRAVENOUS | Status: AC
  Administered 2023-07-18: 1000 mL via INTRAVENOUS

## 2023-07-18 MED ORDER — LACTULOSE 10 GM/15ML PO SOLN
20.0000 g | Freq: Once | ORAL | Status: AC
  Administered 2023-07-18: 20 g via ORAL
  Filled 2023-07-18: qty 30

## 2023-07-18 MED ORDER — IOHEXOL 300 MG/ML  SOLN
100.0000 mL | Freq: Once | INTRAMUSCULAR | Status: AC | PRN
Start: 2023-07-18 — End: 2023-07-18
  Administered 2023-07-18: 100 mL via INTRAVENOUS

## 2023-07-18 MED ORDER — LACTULOSE 10 GM/15ML PO SOLN: 20.0000 g | Freq: Two times a day (BID) | ORAL | 0 refills | Status: AC | PRN

## 2023-07-18 NOTE — Progress Notes (Addendum)
 Patient called with concerns related to not having a bowel movement since June 2nd (the day before his surgery). It has been 10 days total.    He reports a 3/10 abdominal pain with tightness in the abdomen and feels like he has a brick stuck in his rectum. He feels like he is barely able to pass gas.   He endorse diaphoresis with feeling like he was going to have a syncopal episode. I suspect that this may be a vagal response to straining. He denies any chest pain or use of insulin.   He has been taking colace daily along with staying hydrated with water, drinking prone juice and being mobile as possible.   He tried three suppositories since 10am this morning with no result. He is still taking his PERCOCET.   Advised patient to go to the ER to evaluation. Patient is agreeable and states he will go to the ER.

## 2023-07-18 NOTE — ED Provider Notes (Signed)
 [facility] Provider Note    Event Date/Time   First MD Initiated Contact with Patient 07/18/23 1522     (approximate)   History   Constipation   HPI  Benjamin Perry is a 48 year old male presenting to the emergency department for evaluation of abdominal distention.  Patient underwent cervical spinal fusion on 6/2.  Has not had a bowel movement since that time.  Has been passing minimal flatus.  Has been taking Percocet for pain control.  Started taking a stool softener a few days ago, but is continued to have worsening abdominal distention and has not had any stool output.  Has tried enemas, suppositories, oral medications.  Reports ongoing pain in his neck from his recent surgery.  No fevers.  Has had some tingling in his arms that he has follow-up arranged with his surgeon for.     Physical Exam   Triage Vital Signs: ED Triage Vitals  Encounter Vitals Group     BP 07/18/23 1456 (!) 156/106     Girls Systolic BP Percentile --      Girls Diastolic BP Percentile --      Boys Systolic BP Percentile --      Boys Diastolic BP Percentile --      Pulse Rate 07/18/23 1456 (!) 126     Resp 07/18/23 1456 16     Temp 07/18/23 1456 97.6 F (36.4 C)     Temp Source 07/18/23 1456 Oral     SpO2 07/18/23 1456 98 %     Weight --      Height --      Head Circumference --      Peak Flow --      Pain Score 07/18/23 1457 10     Pain Loc --      Pain Education --      Exclude from Growth Chart --     Most recent vital signs: Vitals:   07/18/23 1456 07/18/23 1645  BP: (!) 156/106 (!) 144/92  Pulse: (!) 126 98  Resp: 16 16  Temp: 97.6 F (36.4 C) 98 F (36.7 C)  SpO2: 98% 98%     General: Awake, interactive  Neck:  Surgical site of her right anterior neck covered with skin glue, no surrounding erythema, discharge CV:  Tachycardic with regular rhythm  Resp:  Unlabored respirations, lungs clear to auscultation Abd:  Distended abdomen but not tense,  mild generalized tenderness without rebound or guarding Neuro:  Symmetric facial movement, fluid speech   ED Results / Procedures / Treatments   Labs (all labs ordered are listed, but only abnormal results are displayed) Labs Reviewed  COMPREHENSIVE METABOLIC PANEL WITH GFR - Abnormal; Notable for the following components:      Result Value   Sodium 132 (*)    Chloride 94 (*)    Glucose, Bld 169 (*)    Total Protein 8.4 (*)    AST 52 (*)    All other components within normal limits  CBC - Abnormal; Notable for the following components:   WBC 20.7 (*)    Platelets 472 (*)    All other components within normal limits  LIPASE, BLOOD     EKG EKG independently reviewed and interpreted by myself demonstrates:  EKG demonstrates sinus tachycardia rate of 114, PR 130, QRS 92, QTc 435, no acute ST changes  RADIOLOGY Imaging independently reviewed and interpreted by myself demonstrates:  CT abdomen pelvis without obstruction or ileus.  Does demonstrate  moderate stool burden.  Formal Radiology Read:  CT ABDOMEN PELVIS W CONTRAST Result Date: 07/18/2023 CLINICAL DATA:  Bowel obstruction EXAM: CT ABDOMEN AND PELVIS WITHOUT CONTRAST TECHNIQUE: Multidetector CT imaging of the abdomen and pelvis was performed following the standard protocol without IV contrast. RADIATION DOSE REDUCTION: This exam was performed according to the departmental dose-optimization program which includes automated exposure control, adjustment of the mA and/or kV according to patient size and/or use of iterative reconstruction technique. COMPARISON:  None Available. FINDINGS: Lower chest: No infiltrates or consolidations, no pleural effusions Hepatobiliary: Liver normal size no masses no biliary dilatation. Gallbladder unremarkable. No gallstones. Ill-defined low-attenuation area within the peripheral portion of the right lobe of the liver segment 8 about 11 mm in maximum diameter incompletely characterized by this images  but could correlate with perhaps with hemangioma and correlation with liver ultrasound recommended. Pancreas: Pancreas normal size. No masses calcifications or inflammatory changes. Spleen: Spleen normal size.  No masses. Adrenals/Urinary Tract: Adrenal glands are normal size. Follow-up recommended. Kidneys are normal. No masses calcifications or hydronephrosis Stomach/Bowel: No small or large bowel obstruction or inflammatory changes. Several fluid-filled small bowel loops midportion of the abdomen. Without definitive evidence of small-bowel obstruction. There is abundant residual fecal material throughout the descending colon and sigmoid colon with minimal fecal impaction in the rectum without obstruction. Moderate amount of residual fecal material throughout the colon without obstruction or constipation. Vascular/Lymphatic: No significant vascular findings are present. No enlarged abdominal or pelvic lymph nodes. Reproductive: .  No masses. Bladder unremarkable. Other: Anterior abdominal wall unremarkable without evidence of umbilical or inguinal hernias Musculoskeletal: Visualized portion of the thoracolumbar spine and pelvic structures grossly unremarkable without evidence of fracture bony abnormalities or soft tissue masses. IMPRESSION: *No evidence of small or large bowel obstruction or inflammatory changes. *Moderate amount of residual fecal material throughout the colon without obstruction or constipation. *Ill-defined low-attenuation area within the peripheral portion of the right lobe of the liver segment 8 about 11 mm in maximum diameter incompletely characterized by this images but could correlate with perhaps with hemangioma and correlation with liver ultrasound. Electronically Signed   By: Fredrich Jefferson M.D.   On: 07/18/2023 16:00    PROCEDURES:  Critical Care performed: No  Procedures   MEDICATIONS ORDERED IN ED: Medications  fentaNYL  (SUBLIMAZE ) injection 50 mcg (50 mcg Intravenous Given  07/18/23 1532)  sodium chloride  0.9 % bolus 1,000 mL (1,000 mLs Intravenous New Bag/Given 07/18/23 1535)  iohexol (OMNIPAQUE) 300 MG/ML solution 100 mL (100 mLs Intravenous Contrast Given 07/18/23 1553)  mineral oil enema 1 enema (1 enema Rectal Given 07/18/23 1640)  fentaNYL  (SUBLIMAZE ) injection 50 mcg (50 mcg Intravenous Given 07/18/23 1642)  lactulose (CHRONULAC) 10 GM/15ML solution 20 g (20 g Oral Given 07/18/23 1657)     IMPRESSION / MDM / ASSESSMENT AND PLAN / ED COURSE  I reviewed the triage vital signs and the nursing notes.  Differential diagnosis includes, but is not limited to, postop ileus, bowel obstruction, opioid associated constipation, other acute intra-abdominal process  Patient's presentation is most consistent with acute presentation with potential threat to life or bodily function.  48 year old male presenting with worsening abdominal distention and pain.  Tachycardic on presentation in the setting of acute pain.  Labs with leukocytosis with WC of 20.7, possibly reactive in the setting of recent surgery.  CMP without critical derangements.  Normal lipase.  Will obtain CT abdomen pelvis to further evaluate.  CT results without evidence of obstruction or ileus.  Did  demonstrate moderate stool burden.  Patient was given an enema and lactulose here with some stool output.  He is comfortable with discharge home and continued bowel regimen at home.  Will DC with prescription for lactulose.  Has follow-up with his surgical team on Monday.  Strict return precautions provided.  Patient discharged in stable condition.      FINAL CLINICAL IMPRESSION(S) / ED DIAGNOSES   Final diagnoses:  Constipation due to opioid therapy     Rx / DC Orders   ED Discharge Orders          Ordered    lactulose (CHRONULAC) 10 GM/15ML solution  2 times daily PRN        07/18/23 1817             Note:  This document was prepared using Dragon voice recognition software and may include  unintentional dictation errors.   Claria Crofts, MD 07/18/23 [phone]

## 2023-07-18 NOTE — ED Triage Notes (Signed)
 Pt to ED via [facility] from home. Pt reports had surgery on 6/2 from herniated disc. Pt given flexeril  and percocet. Pt reports increased abd distention and has not had a BM in 10 days. Pt has had 3 enemas, 2 suppositories, prune juice, coffee, water and sodium citrate today without relief. Pt also reports numbness tingling in leg and hands. Pt HR 120s in triage. Last percocet was at 4am. Pt reports taking pain meds Q4hrs.

## 2023-07-18 NOTE — Discharge Instructions (Addendum)
 You were seen in the ER today for evaluation of your abdominal discomfort.  I suspect this is related to constipation in the setting of your recent surgery and pain medication use.  I have included information about constipation below.  I have also sent a prescription for lactulose to your pharmacy that you can take as needed to help with your constipation.  Keep your scheduled follow-up with your surgeon.  Follow-up with your primary care doctor as needed.  Return to the ER for new or worsening symptoms.  To help prevent constipation in the future please: Eat a high-fiber diet Drink water and other fluids during the day Go to the bathroom at regular times every day Limit your use of narcotic pain medication  To treat your constipation:  Take a capful of MiraLAX 1-2 times daily You can also try over-the-counter stool softener such as Dulcolax or milk of magnesia You can use over-the-counter medications such as senna, but only use this for a few days. Do not use this for a prolonged period.  If you are still having constipation, you can try an over-the-counter suppository or enema Use your prescribed lactulose as needed for constipation not resolved by the above

## 2023-07-18 NOTE — Progress Notes (Signed)
Agree with patient going to ED

## 2023-07-22 ENCOUNTER — Ambulatory Visit (INDEPENDENT_AMBULATORY_CARE_PROVIDER_SITE_OTHER): Admitting: Physician Assistant

## 2023-07-22 ENCOUNTER — Encounter: Payer: Self-pay | Admitting: Physician Assistant

## 2023-07-22 ENCOUNTER — Telehealth: Payer: Self-pay

## 2023-07-22 VITALS — BP 142/78 | Temp 98.2°F | Ht 64.0 in | Wt 162.0 lb

## 2023-07-22 DIAGNOSIS — Z981 Arthrodesis status: Secondary | ICD-10-CM

## 2023-07-22 DIAGNOSIS — M50322 Other cervical disc degeneration at C5-C6 level: Secondary | ICD-10-CM

## 2023-07-22 MED ORDER — GABAPENTIN 300 MG PO CAPS: 300.0000 mg | ORAL_CAPSULE | Freq: Three times a day (TID) | ORAL | 2 refills | Status: DC

## 2023-07-22 MED ORDER — TIZANIDINE HCL 4 MG PO TABS: 4.0000 mg | ORAL_TABLET | Freq: Three times a day (TID) | ORAL | 1 refills | Status: AC | PRN

## 2023-07-22 MED ORDER — OXYCODONE HCL 5 MG PO TABS: 5.0000 mg | ORAL_TABLET | ORAL | 0 refills | Status: DC | PRN

## 2023-07-22 NOTE — Progress Notes (Signed)
   REFERRING PHYSICIAN:  [facility], [place] [address] [place],  [place] [address]  DOS: 07/09/23, C5-6 ACDF  HISTORY OF PRESENT ILLNESS: Benjamin Perry is 2 weeks status post C5-6 ACDF for myelopathy. Overall, he is doing okay.  He feels as though his left side which originally had the majority of the pain numbness and tingling is feeling good, but now he is having increased pain on his right arm and leg.  He has ran out of pain medication and this has been keeping him up at night consistently.  Unfortunately he also did have bad constipation in which she went to the ER for last week.    PHYSICAL EXAMINATION:  NEUROLOGICAL:  General: In no acute distress.   Awake, alert, oriented to person, place, and time.  Pupils equal round and reactive to light.  Facial tone is symmetric.    Strength: Side Biceps Triceps Deltoid Interossei Grip Wrist Ext. Wrist Flex.  R 5 5 5 5 5 5 5   L 4 4+ 5 4+ 4+ 4- 4         Incision c/d/I  Imaging:  No image prior to his appointment.  Assessment / Plan: Benjamin Perry is doing okay after C5-6 ACDF for myelopathy 2 weeks ago.  He is not having more pain in his right arm and leg compared to his left prior to surgery.  His examination is to baseline.We discussed activity escalation and I have advised the patient to lift up to 10 pounds until 6 weeks after surgery, then increase up to 25 pounds until 12 weeks after surgery.  After 12 weeks post-op, the patient advised to increase activity as tolerated. he will return to clinic in approximately 1 month for his 6-week postop.  Counseled patient on recovery from surgery in the setting of myelopathy and reinnervation pain.  Encouraged him reminded him that the first 2 weeks are the worst.  Goal is to get pain under better control.  More oxycodone  sent as well as different muscle relaxer and gabapentin trial.  Risk and benefits of all these medications were discussed at length and  patient agrees to continue.  Again, patient did have to go to the emergency department due to constipation after surgery.  He was counseled on the importance of using a stool softener and laxative.    Advised to contact the office if any questions or concerns arise.   Ludwig Safer PA-C Dept of Neurosurgery

## 2023-07-22 NOTE — Telephone Encounter (Signed)
 Patient notified  PA has been approved Case ID: [id number] Expires: 08/21/2023

## 2023-07-22 NOTE — Telephone Encounter (Signed)
 Received authorization request for oxycodone  from [employer]. This been sent to the plan by [name], CMA and is currently pending review.    KeyJodelle Mungo PA Case ID #: [id number] Rx #: [id number]

## 2023-07-29 ENCOUNTER — Telehealth: Payer: Self-pay | Admitting: Neurosurgery

## 2023-07-29 NOTE — Telephone Encounter (Signed)
 post op C5-6 ACDF on 07/09/23   Patient is calling to request Percocet. He states that the Oxycodone  is not working for him. He states that he is having pain in both legs and is having tingling and numbness in the right leg and states that when he gets up in the morning his left leg seems like it is giving out. He states that he is having a burning sensation and a warm sensation in his neck. [facility] [address]

## 2023-07-29 NOTE — Telephone Encounter (Signed)
 I left a message for the patient to return my call so I can obtain more information.  I would like more information regarding if he is having any fevers or concerns with his incision given the burning and warm sensation mentioned in his message.  He was given percocet 10/325 on 07/14/23 and oxycodone  5mg  on 07/22/23. He may be requesting the percocet because it was stronger. He may also be out of his pain medication completely, as his last rx was oxycodone  5mg  on 07/22/23 and it was a 7 day supply.

## 2023-07-30 ENCOUNTER — Other Ambulatory Visit: Payer: Self-pay | Admitting: Physician Assistant

## 2023-07-30 DIAGNOSIS — M4712 Other spondylosis with myelopathy, cervical region: Secondary | ICD-10-CM

## 2023-07-30 DIAGNOSIS — Z981 Arthrodesis status: Secondary | ICD-10-CM

## 2023-07-30 DIAGNOSIS — M50322 Other cervical disc degeneration at C5-C6 level: Secondary | ICD-10-CM

## 2023-07-30 MED ORDER — OXYCODONE-ACETAMINOPHEN 10-325 MG PO TABS: 1.0000 | ORAL_TABLET | Freq: Four times a day (QID) | ORAL | 0 refills | Status: DC | PRN

## 2023-07-30 NOTE — Telephone Encounter (Signed)
 I spoke with the patient and he stated that he has pain that goes into his shoulders to the elbows and will also go into the back causing numbness and tingling in the right leg that goes into the foot. He is also complaining of pain in the left buttock down into the calf for the past week. He complains of a burning sensation in his back, and denies any fever or chills.   He stated that the pharmacy only gave him a 5 day supply on 07/22/23 of the Oxycodone  5mg  even though it was for a 7 day supply. He is also out of the Oxycodone  10-325mg . He states that the oxycodone  10-325mg  worked better to help with his pain.

## 2023-07-30 NOTE — Telephone Encounter (Signed)
 Patient called again this afternoon following up on the request- informed the patient that this was sent to the pharmacy for him

## 2023-08-06 ENCOUNTER — Other Ambulatory Visit: Payer: Self-pay | Admitting: Physician Assistant

## 2023-08-06 ENCOUNTER — Telehealth: Payer: Self-pay | Admitting: Neurosurgery

## 2023-08-06 DIAGNOSIS — Z981 Arthrodesis status: Secondary | ICD-10-CM

## 2023-08-06 DIAGNOSIS — M50322 Other cervical disc degeneration at C5-C6 level: Secondary | ICD-10-CM

## 2023-08-06 DIAGNOSIS — M4712 Other spondylosis with myelopathy, cervical region: Secondary | ICD-10-CM

## 2023-08-06 MED ORDER — OXYCODONE-ACETAMINOPHEN 10-325 MG PO TABS: 1.0000 | ORAL_TABLET | Freq: Four times a day (QID) | ORAL | 0 refills | Status: DC | PRN

## 2023-08-06 NOTE — Telephone Encounter (Signed)
 Patient aware of medication refill and he confirmed appt for 7/2 with Danielle. He might cancel if he can not find transportation.

## 2023-08-06 NOTE — Telephone Encounter (Signed)
 Danielle said she can see him at 10:30am tomorrow (7/2).  Please let him know his refill was sent and offer him an appointment with Edsel tomorrow at 10:30am

## 2023-08-06 NOTE — Telephone Encounter (Signed)
 C5-6 ACDF on 07/09/23 FU 08/19/23 He taking his last pain pill at 1:30pm today Patient is calling that his pain is worse that before surgery. He hurts from his neck down to his ankles. It is hard for him to get out of bed every morning. Why would he be in more pain after the surgery? He is asking why is medicaid or the pharmacy only allowing him to get a 5 day supply? He would like to try and get it filled for more than 5 days. [facility] [address]

## 2023-08-07 ENCOUNTER — Encounter: Admitting: Neurosurgery

## 2023-08-12 ENCOUNTER — Ambulatory Visit
Admission: RE | Admit: 2023-08-12 | Discharge: 2023-08-12 | Disposition: A | Discharge status: Home / Self Care | Source: Ambulatory Visit | Attending: Neurosurgery | Admitting: Neurosurgery

## 2023-08-12 ENCOUNTER — Ambulatory Visit
Admission: RE | Admit: 2023-08-12 | Discharge: 2023-08-12 | Disposition: A | Discharge status: Home / Self Care | Attending: Neurosurgery | Admitting: Neurosurgery

## 2023-08-12 ENCOUNTER — Ambulatory Visit (INDEPENDENT_AMBULATORY_CARE_PROVIDER_SITE_OTHER): Admitting: Neurosurgery

## 2023-08-12 VITALS — BP 134/86 | Ht 64.0 in | Wt 159.5 lb

## 2023-08-12 DIAGNOSIS — M50322 Other cervical disc degeneration at C5-C6 level: Secondary | ICD-10-CM

## 2023-08-12 DIAGNOSIS — Z981 Arthrodesis status: Secondary | ICD-10-CM

## 2023-08-12 DIAGNOSIS — M4712 Other spondylosis with myelopathy, cervical region: Secondary | ICD-10-CM | POA: Diagnosis not present

## 2023-08-12 DIAGNOSIS — Z4789 Encounter for other orthopedic aftercare: Secondary | ICD-10-CM | POA: Diagnosis not present

## 2023-08-12 DIAGNOSIS — M48062 Spinal stenosis, lumbar region with neurogenic claudication: Secondary | ICD-10-CM

## 2023-08-12 DIAGNOSIS — R29898 Other symptoms and signs involving the musculoskeletal system: Secondary | ICD-10-CM

## 2023-08-12 MED ORDER — METHYLPREDNISOLONE 4 MG PO TBPK
ORAL_TABLET | ORAL | 0 refills | Status: DC
Start: 2023-08-12 — End: 2023-08-28

## 2023-08-12 MED ORDER — OXYCODONE-ACETAMINOPHEN 10-325 MG PO TABS: 1.0000 | ORAL_TABLET | ORAL | 0 refills | Status: DC | PRN

## 2023-08-12 NOTE — Progress Notes (Signed)
   REFERRING PHYSICIAN:  No referring provider defined for this encounter.  DOS: 07/09/23, C5-6 ACDF  HISTORY OF PRESENT ILLNESS:  08/12/23 Benjamin Perry is a 48 y.o presenting today for an acute follow up after contacting the office with worsening symptoms. Today he reports ongoing neck and interscapular pain as well as some pain into his elbow since surgery. This is making sleeping at night a little challenging.  Despite these symptoms his primary complaint is low back pain and pain is radiating into his right buttocks down the back of his right leg and into the bottom foot.  He has similar symptoms on the left side to a lesser degree.  He also describes constant aching pain in his calf that feels like pins and walking as all day.  This started about 2 weeks after his surgery and has progressively worsened.  07/09/23 Benjamin Perry is 2 weeks status post C5-6 ACDF for myelopathy. Overall, he is doing okay.  He feels as though his left side which originally had the majority of the pain numbness and tingling is feeling good, but now he is having increased pain on his right arm and leg.  He has ran out of pain medication and this has been keeping him up at night consistently.  Unfortunately he also did have bad constipation in which she went to the ER for last week.    PHYSICAL EXAMINATION:  NEUROLOGICAL:  General: In no acute distress.   Awake, alert, oriented to person, place, and time.  Pupils equal round and reactive to light.  Facial tone is symmetric.    Strength: Side Biceps Triceps Deltoid Interossei Grip Wrist Ext. Wrist Flex.  R 5 5 5 5 5 5 5   L 4 4+ 5 4+ 4+ 4- 4    Side Iliopsoas Quads Hamstring PF DF EHL  R 3 4 5 5 3 3   L 4+ 4+ 5 5 4+ 4    Incision c/d/I  Imaging:  No image prior to his appointment.  Assessment / Plan: Benjamin Perry is doing poorly after C5-6 ACDF for myelopathy. While his cervical symptoms are largely stable and expected cervical fusion, he has worsening low  back pain and new right lower extremity weakness that started about 2 weeks after surgery.  In reviewing his lumbar MRI he does have significant lumbar stenosis and neuroforaminal stenosis that may explain the symptoms however given the worsening of his symptoms I would like to update a lumbar MRI.  In the meantime I have sent him for cervical x-rays for his postop evaluation.  These were placed x-rays planned for his follow-up appointment on 7/14.  I have also ordered an updated lumbar MRI, given him a refill of Oxycodone , and started a Medrol  Dosepak.  Our office will contact him with the results of his x-rays.  He is to keep his 7/14 follow up with Dr. Claudene. I am hopefully that his MRI will be completed by this time for review. He is to contact us  after completing the steroid taper and we will start Celebrex if the steroids provided some relief. I also recommended that he increase his night time dose of Gabapentin  to 600mg .   Benjamin Goods PA-C Dept of Neurosurgery

## 2023-08-19 ENCOUNTER — Encounter: Admitting: Neurosurgery

## 2023-08-20 ENCOUNTER — Other Ambulatory Visit: Payer: Self-pay | Admitting: Physician Assistant

## 2023-08-20 ENCOUNTER — Ambulatory Visit
Admission: RE | Admit: 2023-08-20 | Discharge: 2023-08-20 | Disposition: A | Discharge status: Home / Self Care | Source: Ambulatory Visit | Attending: Neurosurgery | Admitting: Neurosurgery

## 2023-08-20 ENCOUNTER — Telehealth: Payer: Self-pay | Admitting: Neurosurgery

## 2023-08-20 DIAGNOSIS — M50322 Other cervical disc degeneration at C5-C6 level: Secondary | ICD-10-CM | POA: Insufficient documentation

## 2023-08-20 DIAGNOSIS — M4712 Other spondylosis with myelopathy, cervical region: Secondary | ICD-10-CM

## 2023-08-20 DIAGNOSIS — Z981 Arthrodesis status: Secondary | ICD-10-CM | POA: Insufficient documentation

## 2023-08-20 DIAGNOSIS — M4807 Spinal stenosis, lumbosacral region: Secondary | ICD-10-CM | POA: Diagnosis not present

## 2023-08-20 DIAGNOSIS — M48061 Spinal stenosis, lumbar region without neurogenic claudication: Secondary | ICD-10-CM | POA: Diagnosis not present

## 2023-08-20 DIAGNOSIS — M5136 Other intervertebral disc degeneration, lumbar region with discogenic back pain only: Secondary | ICD-10-CM | POA: Diagnosis not present

## 2023-08-20 MED ORDER — OXYCODONE-ACETAMINOPHEN 10-325 MG PO TABS: 1.0000 | ORAL_TABLET | ORAL | 0 refills | Status: AC | PRN

## 2023-08-20 NOTE — Telephone Encounter (Signed)
 Patient called again about medication request. I did inform patient that requests can take up to 24 hours and we will give him a phone call once the request is completed.  Also mentioned to patient we did receive a message from PA Danielle about possibly moving his appointment on the 23rd, I told him once I hear back about both I will give him a call.

## 2023-08-20 NOTE — Telephone Encounter (Signed)
 C5-6 ACDF on 07/09/23   Patient is calling to request a refill of Percocet and the Prednisone .   [facility] in [place]

## 2023-08-20 NOTE — Telephone Encounter (Signed)
 Patient notified

## 2023-08-28 ENCOUNTER — Ambulatory Visit (INDEPENDENT_AMBULATORY_CARE_PROVIDER_SITE_OTHER): Admitting: Neurosurgery

## 2023-08-28 ENCOUNTER — Ambulatory Visit
Admission: RE | Admit: 2023-08-28 | Discharge: 2023-08-28 | Disposition: A | Discharge status: Home / Self Care | Attending: Neurosurgery | Admitting: Neurosurgery

## 2023-08-28 ENCOUNTER — Encounter: Payer: Self-pay | Admitting: Neurosurgery

## 2023-08-28 ENCOUNTER — Ambulatory Visit
Admission: RE | Admit: 2023-08-28 | Discharge: 2023-08-28 | Disposition: A | Discharge status: Home / Self Care | Source: Ambulatory Visit | Attending: Neurosurgery | Admitting: Neurosurgery

## 2023-08-28 VITALS — BP 136/72 | Temp 98.9°F | Ht 64.0 in | Wt 156.0 lb

## 2023-08-28 DIAGNOSIS — M48062 Spinal stenosis, lumbar region with neurogenic claudication: Secondary | ICD-10-CM

## 2023-08-28 DIAGNOSIS — Z981 Arthrodesis status: Secondary | ICD-10-CM

## 2023-08-28 DIAGNOSIS — Z0389 Encounter for observation for other suspected diseases and conditions ruled out: Secondary | ICD-10-CM | POA: Diagnosis not present

## 2023-08-28 DIAGNOSIS — M50322 Other cervical disc degeneration at C5-C6 level: Secondary | ICD-10-CM

## 2023-08-28 MED ORDER — OXYCODONE-ACETAMINOPHEN 10-325 MG PO TABS: 1.0000 | ORAL_TABLET | ORAL | 0 refills | Status: DC | PRN

## 2023-08-28 NOTE — Progress Notes (Signed)
 REFERRING PHYSICIAN:  [facility], [place] [address] [place],  [place] [address]  DOS: 07/09/23, C5-6 ACDF  HISTORY OF PRESENT ILLNESS:  08/28/2023 Following up from a visit with our APP clinic.  He was previously doing well from a anterior cervical discectomy and fusion, however had a worsening of his low back pain and pain radiating into his legs and buttocks.  He had an MRI of his lumbar spine which demonstrated severe stenosis from L4-L5.  This is likely secondary to a broad-based disc bulge and posterior facet ligamentum hypertrophy    PHYSICAL EXAMINATION:  NEUROLOGICAL:  General: In no acute distress.   Awake, alert, oriented to person, place, and time.  Pupils equal round and reactive to light.  Facial tone is symmetric.    Strength: His lower extremity exam is difficult to evaluate due to significant amount of pain and giveaway weakness but he is at least 4 throughout.  He is antigravity in walking.   Incision c/d/I  Imaging:  arrative & Impression  CLINICAL DATA:  48 year old male with low back pain radiating to the left knee and hip for months.   EXAM: MRI LUMBAR SPINE WITHOUT CONTRAST   TECHNIQUE: Multiplanar, multisequence MR imaging of the lumbar spine was performed. No intravenous contrast was administered.   COMPARISON:  Total spine MRI 06/16/2023. CT Abdomen and Pelvis 07/18/2023.   FINDINGS: Segmentation: Transitional lumbosacral anatomy, with lowest full size disc space S1-S2 when numbering from the skull base on total spine MRI in May. Correlation with radiographs is recommended prior to any operative intervention.   Alignment: Stable lumbar lordosis. No significant scoliosis or spondylolisthesis.   Vertebrae: Normal background bone marrow signal. Maintained vertebral height. Intact visible sacrum and SI joints. No marrow edema or evidence of acute osseous abnormality.   Conus medullaris and cauda equina: Conus extends  to the L1-L2 level. No lower spinal cord or conus signal abnormality.   Paraspinal and other soft tissues: Negative.   Disc levels:   T12-L1:  Negative.   L1-L2:  Negative disc.  Mild facet hypertrophy.  No stenosis.   L2-L3: Stable circumferential disc bulge, mostly far lateral. Mild to moderate facet hypertrophy greater on the right. No stenosis.   L3-L4: Stable mostly far lateral disc bulging, mild to moderate facet and ligament flavum hypertrophy. No significant stenosis.   L4-L5: Similar circumferential, far lateral disc bulging, mild to moderate facet and ligament flavum hypertrophy. Stable thecal sac patency, no significant spinal stenosis. Up to mild associated foraminal and lateral recess stenosis greater on the right. This level stable.   L5-S1: Severe spinal stenosis. Circumferential disc bulge with endplate spurring, moderate posterior element hypertrophy. See series 8, image 24. Symmetric lateral recess involvement at the descending S1 nerve levels. Mild left and moderate right L5 foraminal stenosis. This level is stable.   S1-S2: Lumbarized and negative.   IMPRESSION: 1. 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 Correlation with radiographs is recommended prior to any operative intervention.   2. Stable MRI appearance of the Lumbar Spine since May. Severe multifactorial spinal, lateral recess, and Moderate right foraminal stenosis at L5-S1.     Electronically Signed   By: VEAR Hurst M.D.   On: 08/20/2023 10:57         Assessment / Plan: Mortimer Bair is overall having a progression of symptoms unrelated to his previous surgery.  He is being seen in follow-up for a anterior cervical  discectomy and fusion.  His cervical symptoms continue to be stable to slightly improved.  His lower extremity and back symptoms are quite debilitating to him.  He was previously seen in our APP clinic,  got a repeat MRI ordered as well as flexion-extension x-rays.  Has had a Medrol  Dosepak as well as pain medications.  His lower back pain is debilitating at this time.  His MRI did show significant stenosis at what they are calling L5-S1 with severe ligamentum flavum hypertrophy, medial facet hypertrophy, and a broad-based disc bulge.  Because of all these findings he has severe stenosis.  This is likely the cause of his pain.  It radiates from his back and buttocks down to the posterior aspect of his legs.  He states that whenever he walks and stands up for a significant amount of time he gets severe claudicatory symptoms in his lower extremities.    At some point he likely need a L5-S1 lumbar decompression due to his severe stenosis, I would like to get flexion-extension x-rays to evaluate for any areas of immobility.  For now we will plan on getting him started with physical therapy.  I like him to keep his follow-up so that we can see whether or not he has to go forward with a decompression.SABRA Penne MICAEL Claudene, MD. Dept of Neurosurgery

## 2023-08-28 NOTE — Patient Instructions (Signed)
 LOCAL PHYSICAL THERAPY  [facility]  [address]  [place], [place] [address]  [phone]   [facility]  [address] [place], [place] [address]  [phone]   [facility] (2 locations)  [address]  [place], [place] [address]  [phone]          or  [address]  [place], [place] [address]  [phone]   [facility]  [address]  [place], [place] [address]  [phone]  **dry needling**   [facility]  [address]  [place], [place] [address]  [phone]  Fax: [phone]  ** Aquatic therapy[address] [place], [place] [address] [phone] **Aquatic therapy**  [facility] [address] [place], [place] [address] [phone]  [facility]  [address]  [place], [place] [address]  [phone]   [facility]  [address]  [place], [place] [address]  [phone]   [facility]  [address]   Benjamin Benjamin [place]  [place], [place] [address]  [phone]   [facility]  [address]  [place], [place] [address]  [phone]  **dry needling**   PELVIC FLOOR/SI JOINT  [facility]  Benjamin Perry, PT  [email or web address]   [place]   [facility]  [address]  Suite [place], [place] [address]  [phone]   [facility] [address] [place], [place] [address] [phone]  [facility] [address] 2nd floor [place], [place] [address] [phone]  [facility] [address] [place]. [place], [place] [address] [phone]  [facility] [address] [place], [place]  [address] [phone]  [facility]  [address]  [place], [place] [address]  [phone]  [facility] [address] [place], [place] [address] [phone]  [facility]  [address] [place], [place] [address] [phone]   [facility] [address] [place], [place] [address] [phone]  [facility] [place]  [address] [place] [address]  [phone]   [facility] [address] [place], [place] [address]  [facility] [phone] [place], [place] [address] [phone]  [facility] [address] [place] [place], [place] [address] [phone]  [facility] [address] [place], [place] [address]  [facility] [address] [place], [place] [address] [phone]  [facility] [address], [place] [address] [phone]  [facility] [address]  [place], [place] [phone]  [facility] [address] [place], [place] [address] [phone]  [facility] [address] [place], [place] [address] [phone]  [facility] [address], [place] [address] [phone]  [facility], [place]   [facility]  Benjamin Perry, PT  [address]  [place], [place] [address] [phone]   Benjamin Perry   [facility]  [address]  [phone]   [facility] [address] [facility]  [address]  [phone]   [facility]   [facility]  [facility]  [address]  [place], [place] [address]  [phone]  **No Aetna or medicaid**   [facility]  [phone] [address] [place] [address]  [phone]   [facility]  [address]  [place], [place] [address]  [phone]   Benjamin Perry, [place]  [address], [place] [address]  [phone]   [facility]  [address] [place] [address]  [phone]   [facility]  [address]  [place], [place] [address]  [phone]  [facility]  (3 locations)  [address]  [place], [place] [address]  [phone]  **dry needling**           or  [address] [place], [place] [address]  [phone]  **Additionally has Benjamin Perry, OT**           or  [address]  [place], [place] [address]  [phone]  **Pediatric therapy**  [facility]  [address]  [phone]  **dry needlingTHORA [facility]  [address]  [place], [place] [address]  [phone]  [facility]   (Inside [address] Fitness)  [address]  [place], [place] [address]  [phone]  **dry needling**  **MEDICAID or UNINSURED** [facility] dept. Of Physical Therapy [place], [place] [address] [id number]  [facility]  [address] [place], [place] [address]  [phone]   [facility]  [address]  [place], [place] [address]  [phone]   [facility]  [address] [place], [place] [address]  [phone]   [facility]  [address]  [place], [place] [address]  [phone]   [facility]  [address] [place], [place] [address]  [phone]   [facility]  [address]  [place], [place] [address]  [phone]   [facility] [address] [place], [place] [address]  AQUATIC   [employer] [facility]  [facility]  [place]   [place]  *Residents only*  [employer] [facility] [employer]  *Residents  only*   [facility]  Exercise class  [facility]  Exercise class    [place], [place]  [facility]  [address]  [phone]   [facility]  [address]  [phone]           or  [address]  [phone]   [facility]   Provides education and support and practical information for coping with arthritis for arthritis sufferers and their families.   When: 12:15 - 1:30 p.m. the second Monday of each month, March through December  Info: Call Rehabilitation Services at [phone]

## 2023-08-30 ENCOUNTER — Encounter: Payer: Self-pay | Admitting: Neurosurgery

## 2023-09-04 ENCOUNTER — Telehealth: Payer: Self-pay | Admitting: Neurosurgery

## 2023-09-04 ENCOUNTER — Other Ambulatory Visit: Payer: Self-pay | Admitting: Physician Assistant

## 2023-09-04 DIAGNOSIS — Z981 Arthrodesis status: Secondary | ICD-10-CM

## 2023-09-04 MED ORDER — OXYCODONE-ACETAMINOPHEN 10-325 MG PO TABS: 1.0000 | ORAL_TABLET | ORAL | 0 refills | Status: DC | PRN

## 2023-09-04 NOTE — Telephone Encounter (Signed)
 Patient is calling to find out what the next step is after his x-rays. He states that he has had to return to work despite his pain. He is requesting a refill of Oxycodone  be sent to [facility] in [place].

## 2023-09-05 NOTE — Telephone Encounter (Signed)
 Left message for patient to call our office back.

## 2023-09-09 NOTE — Telephone Encounter (Signed)
 Left message for patient to call our office back.

## 2023-09-10 ENCOUNTER — Telehealth: Payer: Self-pay | Admitting: Neurosurgery

## 2023-09-10 ENCOUNTER — Other Ambulatory Visit: Payer: Self-pay | Admitting: Neurosurgery

## 2023-09-10 ENCOUNTER — Telehealth: Payer: Self-pay | Admitting: Physician Assistant

## 2023-09-10 DIAGNOSIS — Z981 Arthrodesis status: Secondary | ICD-10-CM

## 2023-09-10 MED ORDER — OXYCODONE-ACETAMINOPHEN 10-325 MG PO TABS: 1.0000 | ORAL_TABLET | Freq: Four times a day (QID) | ORAL | 0 refills | Status: DC | PRN

## 2023-09-10 NOTE — Telephone Encounter (Signed)
 Patient notified of pain management referral and provided with the phone number.

## 2023-09-10 NOTE — Telephone Encounter (Signed)
 Patient would like to know the results of his x-rays and the next step in his treatment plan. Please advise.

## 2023-09-10 NOTE — Telephone Encounter (Signed)
 Patient notified and expressed understanding.

## 2023-09-10 NOTE — Telephone Encounter (Signed)
 Patient is calling to see if you would continue sending in his Oxycodone  until he sees pain management on 10/03/2023. He would like this sent to [facility] in [place].

## 2023-09-11 NOTE — Telephone Encounter (Signed)
 We haven't received any prior authorization request. However, his previous auth w/ Covermymeds expired in July, so he probably needs a new one.

## 2023-09-11 NOTE — Telephone Encounter (Signed)
 Patient is calling to let our office know that his insurance is not covering his Oxycodone . Does this need a prior authorization?

## 2023-09-12 NOTE — Telephone Encounter (Signed)
 Oxycodone -acetaminophen  authorization has been initiated on covermymeds. Request is pending review. (Key: [id number])

## 2023-09-12 NOTE — Telephone Encounter (Signed)
 Called patient and left a message letting him know this information

## 2023-09-13 NOTE — Telephone Encounter (Signed)
 PA denied. Called patient no answer. Mychart sent to the patient, attached a [employer]RX coupon that patient can use to pay with

## 2023-09-19 ENCOUNTER — Telehealth: Payer: Self-pay | Admitting: Physician Assistant

## 2023-09-19 DIAGNOSIS — Z981 Arthrodesis status: Secondary | ICD-10-CM

## 2023-09-19 MED ORDER — OXYCODONE-ACETAMINOPHEN 10-325 MG PO TABS: 1.0000 | ORAL_TABLET | Freq: Four times a day (QID) | ORAL | 0 refills | Status: DC | PRN

## 2023-09-19 NOTE — Telephone Encounter (Signed)
 Patient called stating that he needs a refill on his pain medication (OXYCODONE ), he has an appointment with the pain clinic on 8.28.25   He also states that he has been having some hip/leg pain. States that his legs are constantly giving out on him. He has an upcoming appointment on the 25th but he did want to try to discuss this sooner with someone. Please advise.

## 2023-09-19 NOTE — Telephone Encounter (Signed)
 I called [facility] to find out what insurance is needing, when we did PA on Oxycodone  a week ago for 25 tablets for 7 day supply it was denied but patient picked up medication and stated it was covered. I spoke with the pharmacist and was advised that his insurance covers a 5 day supply at 1 tablet every 6 hours for #20-she ran this amount while on the phone with me and it was covered and insurance covered it. Patient advised and sending to [place] to update on the change and the issue with the coverage and to get this updated in the chart, not sure I am able to as this is a controlled medication will let provider review.

## 2023-09-19 NOTE — Telephone Encounter (Signed)
 Starting physical therapy next week.  Pharmacy states they are having the same issue as last time with refilling the prescription due to insurance. He states someone in the office fixed this issue for him before.

## 2023-09-20 NOTE — Telephone Encounter (Signed)
 No new prescription is needed at this time. Just FYI as next refill would just need to be for 20 tablets and it may come sooner than 7 days since it is a shorter day supply coverage with his insurance. Just wanted it to be noted in the chart

## 2023-09-23 ENCOUNTER — Other Ambulatory Visit: Payer: Self-pay

## 2023-09-23 DIAGNOSIS — M4712 Other spondylosis with myelopathy, cervical region: Secondary | ICD-10-CM

## 2023-09-23 DIAGNOSIS — M48062 Spinal stenosis, lumbar region with neurogenic claudication: Secondary | ICD-10-CM

## 2023-09-26 ENCOUNTER — Telehealth: Payer: Self-pay | Admitting: Physician Assistant

## 2023-09-26 ENCOUNTER — Other Ambulatory Visit: Payer: Self-pay | Admitting: Physician Assistant

## 2023-09-26 DIAGNOSIS — Z981 Arthrodesis status: Secondary | ICD-10-CM

## 2023-09-26 MED ORDER — OXYCODONE-ACETAMINOPHEN 10-325 MG PO TABS: 1.0000 | ORAL_TABLET | Freq: Four times a day (QID) | ORAL | 0 refills | Status: DC | PRN

## 2023-09-26 NOTE — Telephone Encounter (Signed)
 Patient is calling to request a refill of Oxycodone  be sent to [facility] in [place]. He states that the quantity that his insurance will pay for is 20 tablets.

## 2023-09-27 NOTE — Telephone Encounter (Signed)
 Noted

## 2023-09-30 ENCOUNTER — Ambulatory Visit
Admission: RE | Admit: 2023-09-30 | Discharge: 2023-09-30 | Disposition: A | Discharge status: Home / Self Care | Source: Ambulatory Visit | Attending: Physician Assistant | Admitting: Physician Assistant

## 2023-09-30 ENCOUNTER — Ambulatory Visit (INDEPENDENT_AMBULATORY_CARE_PROVIDER_SITE_OTHER): Admitting: Physician Assistant

## 2023-09-30 ENCOUNTER — Encounter: Payer: Self-pay | Admitting: Physician Assistant

## 2023-09-30 ENCOUNTER — Ambulatory Visit
Admission: RE | Admit: 2023-09-30 | Discharge: 2023-09-30 | Disposition: A | Discharge status: Home / Self Care | Attending: Physician Assistant | Admitting: Physician Assistant

## 2023-09-30 ENCOUNTER — Ambulatory Visit
Admission: RE | Admit: 2023-09-30 | Discharge: 2023-09-30 | Disposition: A | Discharge status: Home / Self Care | Source: Home / Self Care | Attending: Physician Assistant | Admitting: Physician Assistant

## 2023-09-30 DIAGNOSIS — M48062 Spinal stenosis, lumbar region with neurogenic claudication: Secondary | ICD-10-CM | POA: Diagnosis not present

## 2023-09-30 DIAGNOSIS — M25552 Pain in left hip: Secondary | ICD-10-CM | POA: Insufficient documentation

## 2023-09-30 DIAGNOSIS — M25562 Pain in left knee: Secondary | ICD-10-CM | POA: Diagnosis not present

## 2023-09-30 DIAGNOSIS — M4712 Other spondylosis with myelopathy, cervical region: Secondary | ICD-10-CM

## 2023-09-30 DIAGNOSIS — Z09 Encounter for follow-up examination after completed treatment for conditions other than malignant neoplasm: Secondary | ICD-10-CM

## 2023-09-30 DIAGNOSIS — M160 Bilateral primary osteoarthritis of hip: Secondary | ICD-10-CM | POA: Diagnosis not present

## 2023-09-30 DIAGNOSIS — Z4789 Encounter for other orthopedic aftercare: Secondary | ICD-10-CM | POA: Diagnosis not present

## 2023-09-30 DIAGNOSIS — M50022 Cervical disc disorder at C5-C6 level with myelopathy: Secondary | ICD-10-CM | POA: Diagnosis not present

## 2023-09-30 DIAGNOSIS — Z981 Arthrodesis status: Secondary | ICD-10-CM | POA: Diagnosis not present

## 2023-09-30 DIAGNOSIS — M50322 Other cervical disc degeneration at C5-C6 level: Secondary | ICD-10-CM

## 2023-09-30 NOTE — Progress Notes (Signed)
 REFERRING PHYSICIAN:  [facility], [place] [address] [place],  [place] [address]  DOS: 07/09/23, C5-6 ACDF  HISTORY OF PRESENT ILLNESS:   Patient is almost 3 months s/p C5-6 ACDF. He was previously doing well from a anterior cervical discectomy and fusion, however had a worsening of his low back pain and pain radiating into his legs and buttocks.  He had an MRI of his lumbar spine which demonstrated severe stenosis from L4-L5.  This is likely secondary to a broad-based disc bulge and posterior facet ligamentum hypertrophy.  Today he states that he feels as though his left hip and knee are very weak and when he stands for a period of time that he feels as though they will give out.  He has that the backs of his calves are very tight he is having trouble doing his day-to-day activity secondary to the pain.     PHYSICAL EXAMINATION:  NEUROLOGICAL:  General: In no acute distress.   Awake, alert, oriented to person, place, and time.  Pupils equal round and reactive to light.  Facial tone is symmetric.    Strength:   +SLR on the left side.  Some giveaway weakness throughout.  At least 4+ in bilateral lower extremities.  He is antigravity in walking.  He does have pain with internal and external rotation of his left hip.   Incision c/d/I  Imaging:     EXAM: LUMBAR SPINE - COMPLETE 4+ VIEW   COMPARISON:  MRI lumbar spine 08/20/2023. With flexion and extension   FINDINGS: There is no evidence of lumbar spine fracture. Alignment is normal. Intervertebral disc spaces are maintained.   IMPRESSION: Negative.     arrative & Impression  CLINICAL DATA:  48 year old male with low back pain radiating to the left knee and hip for months.   EXAM: MRI LUMBAR SPINE WITHOUT CONTRAST   TECHNIQUE: Multiplanar, multisequence MR imaging of the lumbar spine was performed. No intravenous contrast was administered.   COMPARISON:  Total spine MRI 06/16/2023. CT  Abdomen and Pelvis 07/18/2023.   FINDINGS: Segmentation: Transitional lumbosacral anatomy, with lowest full size disc space S1-S2 when numbering from the skull base on total spine MRI in May. Correlation with radiographs is recommended prior to any operative intervention.   Alignment: Stable lumbar lordosis. No significant scoliosis or spondylolisthesis.   Vertebrae: Normal background bone marrow signal. Maintained vertebral height. Intact visible sacrum and SI joints. No marrow edema or evidence of acute osseous abnormality.   Conus medullaris and cauda equina: Conus extends to the L1-L2 level. No lower spinal cord or conus signal abnormality.   Paraspinal and other soft tissues: Negative.   Disc levels:   T12-L1:  Negative.   L1-L2:  Negative disc.  Mild facet hypertrophy.  No stenosis.   L2-L3: Stable circumferential disc bulge, mostly far lateral. Mild to moderate facet hypertrophy greater on the right. No stenosis.   L3-L4: Stable mostly far lateral disc bulging, mild to moderate facet and ligament flavum hypertrophy. No significant stenosis.   L4-L5: Similar circumferential, far lateral disc bulging, mild to moderate facet and ligament flavum hypertrophy. Stable thecal sac patency, no significant spinal stenosis. Up to mild associated foraminal and lateral recess stenosis greater on the right. This level stable.   L5-S1: Severe spinal stenosis. Circumferential disc bulge with endplate spurring, moderate posterior element hypertrophy. See series 8, image 24. Symmetric lateral recess involvement at the descending S1 nerve levels. Mild left and moderate right L5 foraminal stenosis. This  level is stable.   S1-S2: Lumbarized and negative.   IMPRESSION: 1. 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 Correlation with radiographs is recommended prior to any operative intervention.   2. Stable MRI  appearance of the Lumbar Spine since May. Severe multifactorial spinal, lateral recess, and Moderate right foraminal stenosis at L5-S1.     Electronically Signed   By: VEAR Hurst M.D.   On: 08/20/2023 10:57         Assessment / Plan: Harbert Fitterer is overall having a progression of symptoms unrelated to his previous surgery.  He is being seen in follow-up for a anterior cervical discectomy and fusion.  His cervical symptoms continue to be stable to slightly improved.  His lower extremity and back symptoms are quite debilitating to him.  His MRI did show significant stenosis at what they are calling L5-S1 with severe ligamentum flavum hypertrophy, medial facet hypertrophy, and a broad-based disc bulge.  Because of all these findings he has severe stenosis.  He states that whenever he walks and stands up for a significant amount of time he gets severe claudicatory symptoms in his lower extremities.    As stated previously, patient likely needs a L5-S1 lumbar decompression due to his severe stenosis.  On x-rays does not show any significant listhesis.  On examination today, he does have pain to internal and external rotation of his left hip.  He does have a positive straight leg raise on the left side.  Overall strength exam remains stable.  At least a 4+ bilateral lower extremities.  Unfortunately he has not been able to do formal physical therapy secondary to cost and timing with his job that he has to work.  Advised patient to undergo home exercise program for his back.  Would also like to make sure there is no hip pathology and x-rays have been ordered of his left hip.  Pending results, will potentially refer to orthopedic surgery.   Lyle Decamp, PA-C Dept of Neurosurgery

## 2023-10-01 ENCOUNTER — Other Ambulatory Visit: Payer: Self-pay | Admitting: Physician Assistant

## 2023-10-01 ENCOUNTER — Telehealth: Payer: Self-pay | Admitting: Physician Assistant

## 2023-10-01 DIAGNOSIS — Z981 Arthrodesis status: Secondary | ICD-10-CM

## 2023-10-01 MED ORDER — OXYCODONE-ACETAMINOPHEN 10-325 MG PO TABS: 1.0000 | ORAL_TABLET | Freq: Four times a day (QID) | ORAL | 0 refills | Status: DC | PRN

## 2023-10-01 NOTE — Telephone Encounter (Signed)
 Prescription Request  10/01/2023  LOV: 09/30/2023  What is the name of the medication or equipment? oxyCODONE -acetaminophen  (PERCOCET) 10-325 MG tablet   Have you contacted your pharmacy to request a refill? No   Which pharmacy would you like this sent to?  [id number] [place], [place] - [address] AT [facility] OF [address] [place] [address] Phone: [phone] Fax: [phone]    Patient notified that their request is being sent to the clinical staff for review and that they should receive a response within 2 business days.   Please advise at [facility] [phone]

## 2023-10-03 ENCOUNTER — Ambulatory Visit: Admitting: Student in an Organized Health Care Education/Training Program

## 2023-10-03 NOTE — Telephone Encounter (Signed)
 Medication refilled on 10/01/2023- left voicemail to notify patient.

## 2023-10-08 ENCOUNTER — Other Ambulatory Visit: Payer: Self-pay | Admitting: Physician Assistant

## 2023-10-08 ENCOUNTER — Telehealth: Payer: Self-pay | Admitting: Physician Assistant

## 2023-10-08 DIAGNOSIS — Z981 Arthrodesis status: Secondary | ICD-10-CM

## 2023-10-08 MED ORDER — OXYCODONE-ACETAMINOPHEN 10-325 MG PO TABS: 1.0000 | ORAL_TABLET | Freq: Four times a day (QID) | ORAL | 0 refills | Status: DC | PRN

## 2023-10-08 NOTE — Progress Notes (Signed)
 If patient misses next pain management appointment at the end of September, we will not be refilling pain medication.

## 2023-10-08 NOTE — Telephone Encounter (Signed)
 Patient states he missed his appointment on Friday with the pain clinic due to work. He did not realize he would not be able to see the pain clinic again until September 30th. Patient would like to know if we can keep sending his oxyCODONE -acetaminophen  (PERCOCET) 10-325 MG tablet until his new appointment date. Please advise

## 2023-10-08 NOTE — Telephone Encounter (Signed)
 Patient called back and states he spoke with [facility] and that his medication needs a prior authorization.

## 2023-10-08 NOTE — Telephone Encounter (Signed)
 PA placed in basket.

## 2023-10-10 NOTE — Telephone Encounter (Signed)
 Left message to call our office back.

## 2023-10-14 ENCOUNTER — Other Ambulatory Visit: Payer: Self-pay | Admitting: Physician Assistant

## 2023-10-14 DIAGNOSIS — M25552 Pain in left hip: Secondary | ICD-10-CM

## 2023-10-18 ENCOUNTER — Telehealth: Payer: Self-pay | Admitting: Physician Assistant

## 2023-10-18 ENCOUNTER — Other Ambulatory Visit: Payer: Self-pay | Admitting: Physician Assistant

## 2023-10-18 DIAGNOSIS — Z981 Arthrodesis status: Secondary | ICD-10-CM

## 2023-10-18 MED ORDER — OXYCODONE-ACETAMINOPHEN 10-325 MG PO TABS: 1.0000 | ORAL_TABLET | Freq: Four times a day (QID) | ORAL | 0 refills | Status: DC | PRN

## 2023-10-18 MED ORDER — GABAPENTIN 300 MG PO CAPS: 300.0000 mg | ORAL_CAPSULE | Freq: Three times a day (TID) | ORAL | 2 refills | Status: DC

## 2023-10-18 NOTE — Telephone Encounter (Signed)
 Patient is calling to request a refill of Gabapentin  and Oxycodone  be sent to [facility] in [place]. He states he will be out of the Oxycodone  today.

## 2023-10-21 NOTE — Telephone Encounter (Signed)
 Left patient a detailed message.

## 2023-10-21 NOTE — Telephone Encounter (Signed)
 Just following back up to make sure you saw this message. Please advise.

## 2023-10-28 ENCOUNTER — Telehealth: Payer: Self-pay | Admitting: Physician Assistant

## 2023-10-28 ENCOUNTER — Other Ambulatory Visit: Payer: Self-pay | Admitting: Physician Assistant

## 2023-10-28 DIAGNOSIS — Z981 Arthrodesis status: Secondary | ICD-10-CM

## 2023-10-28 MED ORDER — OXYCODONE-ACETAMINOPHEN 10-325 MG PO TABS: 1.0000 | ORAL_TABLET | Freq: Four times a day (QID) | ORAL | 0 refills | Status: AC | PRN

## 2023-10-28 NOTE — Telephone Encounter (Signed)
Left message that refill was sent.

## 2023-10-28 NOTE — Telephone Encounter (Signed)
 Patient is calling to request a refill of Oxycodone  to be sent to [facility] in [place]. He states that the constant pain that he has been having in his left leg has now moved to his right leg and his right leg has been giving out. He states that this started last week. He is scheduled to see pain management on 11/05/2023.

## 2023-11-04 ENCOUNTER — Other Ambulatory Visit: Payer: Self-pay | Admitting: Physician Assistant

## 2023-11-04 ENCOUNTER — Telehealth: Payer: Self-pay | Admitting: Physician Assistant

## 2023-11-04 NOTE — Telephone Encounter (Signed)
 Patient is calling to request a refill of Oxycodone  be sent to [facility] in [place]. He sees pain management tomorrow and has also been added on for a telephone visit tomorrow to discuss the increased pain in his right leg.

## 2023-11-04 NOTE — Telephone Encounter (Signed)
 Left message for patient to call our office back.

## 2023-11-04 NOTE — Telephone Encounter (Signed)
 Patient notified and expressed understanding and was advised to speak with pain management tomorrow.

## 2023-11-05 ENCOUNTER — Ambulatory Visit: Admitting: Student in an Organized Health Care Education/Training Program

## 2023-11-05 ENCOUNTER — Ambulatory Visit (INDEPENDENT_AMBULATORY_CARE_PROVIDER_SITE_OTHER): Admitting: Physician Assistant

## 2023-11-05 DIAGNOSIS — Z981 Arthrodesis status: Secondary | ICD-10-CM | POA: Diagnosis not present

## 2023-11-05 DIAGNOSIS — Z09 Encounter for follow-up examination after completed treatment for conditions other than malignant neoplasm: Secondary | ICD-10-CM | POA: Diagnosis not present

## 2023-11-05 NOTE — Progress Notes (Signed)
 Patient missed his second pain management appointment that was made for him.  Patient has been told multiple times that he would need to have further narcotic medication prescribed to him by his primary care provider or be established by pain medication clinic.  He has yet to do this.  He also has run out of his oxycodone  prior to when he was due.  I would not be prescribe any more narcotics to this patient.  This was explained to him at length.

## 2023-12-14 ENCOUNTER — Other Ambulatory Visit: Payer: Self-pay | Admitting: Physician Assistant
